# Patient Record
Sex: Female | Born: 1940
Health system: Southern US, Community
[De-identification: ages and names within clinical notes are randomized; demographics above are authoritative.]

## PROBLEM LIST (undated history)

## (undated) DIAGNOSIS — I10 Essential (primary) hypertension: Secondary | ICD-10-CM

## (undated) DIAGNOSIS — M1990 Unspecified osteoarthritis, unspecified site: Secondary | ICD-10-CM

## (undated) HISTORY — DX: Essential (primary) hypertension: I10

## (undated) HISTORY — DX: Unspecified osteoarthritis, unspecified site: M19.90

## (undated) HISTORY — PX: NASAL SINUS SURGERY: SHX719

## (undated) HISTORY — PX: OTHER SURGICAL HISTORY: SHX169

## (undated) HISTORY — PX: BUNIONECTOMY: SHX129

## (undated) HISTORY — PX: JOINT REPLACEMENT: SHX530

---

## 1998-03-21 ENCOUNTER — Other Ambulatory Visit: Admission: RE | Admit: 1998-03-21 | Discharge: 1998-03-21 | Payer: Self-pay | Admitting: Gynecology

## 1999-03-08 ENCOUNTER — Other Ambulatory Visit: Admission: RE | Admit: 1999-03-08 | Discharge: 1999-03-08 | Payer: Self-pay | Admitting: Gynecology

## 2000-06-25 ENCOUNTER — Other Ambulatory Visit: Admission: RE | Admit: 2000-06-25 | Discharge: 2000-06-25 | Payer: Self-pay | Admitting: Gynecology

## 2002-03-22 ENCOUNTER — Other Ambulatory Visit: Admission: RE | Admit: 2002-03-22 | Discharge: 2002-03-22 | Payer: Self-pay | Admitting: Gynecology

## 2002-06-23 HISTORY — PX: COLONOSCOPY: SHX174

## 2002-07-20 ENCOUNTER — Ambulatory Visit (HOSPITAL_COMMUNITY): Admission: RE | Admit: 2002-07-20 | Discharge: 2002-07-20 | Payer: Self-pay | Admitting: Gastroenterology

## 2003-12-05 ENCOUNTER — Other Ambulatory Visit: Admission: RE | Admit: 2003-12-05 | Discharge: 2003-12-05 | Payer: Self-pay | Admitting: Gynecology

## 2004-01-06 ENCOUNTER — Other Ambulatory Visit: Admission: RE | Admit: 2004-01-06 | Discharge: 2004-01-06 | Payer: Self-pay | Admitting: Gynecology

## 2004-02-14 DIAGNOSIS — B083 Erythema infectiosum [fifth disease]: Secondary | ICD-10-CM | POA: Insufficient documentation

## 2004-05-30 ENCOUNTER — Other Ambulatory Visit: Admission: RE | Admit: 2004-05-30 | Discharge: 2004-05-30 | Payer: Self-pay | Admitting: Gynecology

## 2006-03-05 ENCOUNTER — Other Ambulatory Visit: Admission: RE | Admit: 2006-03-05 | Discharge: 2006-03-05 | Payer: Self-pay | Admitting: Gynecology

## 2007-06-24 LAB — HM COLONOSCOPY

## 2008-08-29 ENCOUNTER — Encounter: Payer: Self-pay | Admitting: Family Medicine

## 2008-09-05 ENCOUNTER — Inpatient Hospital Stay (HOSPITAL_COMMUNITY): Admission: RE | Admit: 2008-09-05 | Discharge: 2008-09-08 | Payer: Self-pay | Admitting: Orthopedic Surgery

## 2008-10-10 ENCOUNTER — Inpatient Hospital Stay (HOSPITAL_COMMUNITY): Admission: RE | Admit: 2008-10-10 | Discharge: 2008-10-12 | Payer: Self-pay | Admitting: Orthopedic Surgery

## 2009-01-04 ENCOUNTER — Ambulatory Visit: Payer: Self-pay | Admitting: Family Medicine

## 2009-01-04 DIAGNOSIS — I10 Essential (primary) hypertension: Secondary | ICD-10-CM

## 2009-01-05 LAB — CONVERTED CEMR LAB
BUN: 12 mg/dL (ref 6–23)
Chloride: 106 meq/L (ref 96–112)
GFR calc non Af Amer: 105.54 mL/min (ref >60)
Glucose, Bld: 104 mg/dL — ABNORMAL HIGH (ref 70–99)
Potassium: 4.8 meq/L (ref 3.5–5.1)

## 2009-02-13 ENCOUNTER — Encounter: Payer: Self-pay | Admitting: Family Medicine

## 2009-02-13 DIAGNOSIS — E785 Hyperlipidemia, unspecified: Secondary | ICD-10-CM | POA: Insufficient documentation

## 2009-02-13 DIAGNOSIS — M810 Age-related osteoporosis without current pathological fracture: Secondary | ICD-10-CM | POA: Insufficient documentation

## 2009-02-13 DIAGNOSIS — L259 Unspecified contact dermatitis, unspecified cause: Secondary | ICD-10-CM

## 2009-04-05 ENCOUNTER — Ambulatory Visit: Payer: Self-pay | Admitting: Family Medicine

## 2009-04-05 DIAGNOSIS — J309 Allergic rhinitis, unspecified: Secondary | ICD-10-CM | POA: Insufficient documentation

## 2009-04-12 ENCOUNTER — Encounter: Payer: Self-pay | Admitting: Family Medicine

## 2009-08-02 ENCOUNTER — Ambulatory Visit: Payer: Self-pay | Admitting: Family Medicine

## 2009-10-11 ENCOUNTER — Ambulatory Visit: Payer: Self-pay | Admitting: Family Medicine

## 2009-12-07 ENCOUNTER — Ambulatory Visit: Payer: Self-pay | Admitting: Gynecology

## 2009-12-07 ENCOUNTER — Other Ambulatory Visit: Admission: RE | Admit: 2009-12-07 | Discharge: 2009-12-07 | Payer: Self-pay | Admitting: Gynecology

## 2009-12-18 ENCOUNTER — Telehealth: Payer: Self-pay | Admitting: Family Medicine

## 2009-12-19 ENCOUNTER — Ambulatory Visit: Payer: Self-pay | Admitting: Family Medicine

## 2009-12-19 DIAGNOSIS — R32 Unspecified urinary incontinence: Secondary | ICD-10-CM

## 2009-12-19 LAB — CONVERTED CEMR LAB
Blood in Urine, dipstick: NEGATIVE
HDL goal, serum: 40 mg/dL
Nitrite: NEGATIVE
Protein, U semiquant: NEGATIVE

## 2009-12-20 LAB — CONVERTED CEMR LAB
CO2: 32 meq/L (ref 19–32)
Chloride: 104 meq/L (ref 96–112)
Direct LDL: 142.6 mg/dL
HDL: 55.7 mg/dL (ref >39.00)
Sodium: 144 meq/L (ref 135–145)
Total CHOL/HDL Ratio: 4
Triglycerides: 95 mg/dL (ref 0.0–149.0)

## 2010-01-19 ENCOUNTER — Ambulatory Visit: Payer: Self-pay | Admitting: Family Medicine

## 2010-06-14 ENCOUNTER — Ambulatory Visit: Payer: Self-pay | Admitting: Family Medicine

## 2010-06-21 ENCOUNTER — Encounter: Payer: Self-pay | Admitting: Family Medicine

## 2010-08-22 ENCOUNTER — Ambulatory Visit: Payer: Self-pay | Admitting: Family Medicine

## 2010-10-23 NOTE — Assessment & Plan Note (Signed)
Summary: flu shot/njr  Nurse Visit   Allergies: 1)  ! Penicillin V Potassium (Penicillin V Potassium) 2)  Fosamax (Alendronate Sodium) 3)  Celebrex (Celecoxib)  Orders Added: 1)  Flu Vaccine 9yrs + MEDICARE PATIENTS [Q2039] 2)  Administration Flu vaccine - MCR [G0008] Flu Vaccine Consent Questions     Do you have a history of severe allergic reactions to this vaccine? no    Any prior history of allergic reactions to egg and/or gelatin? no    Do you have a sensitivity to the preservative Thimersol? no    Do you have a past history of Guillan-Barre Syndrome? no    Do you currently have an acute febrile illness? no    Have you ever had a severe reaction to latex? no    Vaccine information given and explained to patient? yes    Are you currently pregnant? no    Lot Number:[id number]   Exp Date:03/23/2011   Site Given  Left Deltoid IM.lbmedflu

## 2010-10-23 NOTE — Progress Notes (Signed)
Summary: Blood pressure concern  Phone Note Call from Patient Call back at Home Phone [phone]   Caller: Patient Reason for Call: Talk to Nurse Summary of Call: Patient has questions regarding blood pressure.  Please call Initial call taken by: Everrett Coombe,  December 18, 2009 9:20 AM  Follow-up for Phone Call        Called and spoke with pt, she is experiencing frequent urination symptoms, saw her OB/GYN and he thinks the HCTZ with her BP pill may be causing this.  Pt states her BP has been OK, does not check it very often,  pt wanted to schedule OV to discuss.  OV scheduled for tomorrow. Follow-up by: Sid Falcon LPN,  December 18, 2009 9:31 AM

## 2010-10-23 NOTE — Assessment & Plan Note (Signed)
Summary: Frequent urination, bladder concerns/nn   Vital Signs:  Patient profile:   70 year old female Weight:      151 pounds Temp:     97.7 degrees F oral BP sitting:   140 / 90  (left arm) Cuff size:   regular  Vitals Entered By: Sid Falcon LPN (December 19, 2009 9:30 AM) CC: frequent urination, bladder concerns, Hypertension Management, Lipid Management   History of Present Illness: Urine symptoms of urine urgency and some mild stress incontinence for several months. GYN did not see evidence for cystocele.  No burning, fever, or chills.  Occ leakage with movement, cough, sneeze.  Occ nocturia.  Minimal caffeine use.    Good stream and no obstructive symptoms.  Other issue is hx hyperlipidemia and requests repeat lipids.  Never treated with meds.  No hx of CAD.  Htn-compliant with meds.  Hypertension History:      She denies headache, chest pain, palpitations, dyspnea with exertion, orthopnea, peripheral edema, neurologic problems, syncope, and side effects from treatment.        Positive major cardiovascular risk factors include female age 71 years old or older, hyperlipidemia, and hypertension.  Negative major cardiovascular risk factors include no history of diabetes, negative family history for ischemic heart disease, and non-tobacco-user status.        Further assessment for target organ damage reveals no history of ASHD, stroke/TIA, or peripheral vascular disease.    Lipid Management History:      Positive NCEP/ATP III risk factors include female age 66 years old or older and hypertension.  Negative NCEP/ATP III risk factors include no history of early menopause without estrogen hormone replacement, non-diabetic, no family history for ischemic heart disease, non-tobacco-user status, no ASHD (atherosclerotic heart disease), no prior stroke/TIA, no peripheral vascular disease, and no history of aortic aneurysm.      Allergies: 1)  ! Penicillin V Potassium (Penicillin V  Potassium) 2)  Fosamax (Alendronate Sodium) 3)  Celebrex (Celecoxib)  Past History:  Past Medical History: Last updated: 10/11/2009  arthritis hay fever, allergies Hypertension Hyperlipidemia, mild Osteoporosis Rosacea PMH reviewed for relevance  Review of Systems  The patient denies fever and hematuria.    Physical Exam  General:  Well-developed,well-nourished,in no acute distress; alert,appropriate and cooperative throughout examination Head:  Normocephalic and atraumatic without obvious abnormalities. No apparent alopecia or balding. Mouth:  Oral mucosa and oropharynx without lesions or exudates.  Teeth in good repair. Neck:  No deformities, masses, or tenderness noted. Lungs:  Normal respiratory effort, chest expands symmetrically. Lungs are clear to auscultation, no crackles or wheezes. Heart:  Normal rate and regular rhythm. S1 and S2 normal without gallop, murmur, click, rub or other extra sounds. Genitalia:  deferred as done recently per GI.   Impression & Recommendations:  Problem # 1:  UNSPECIFIED URINARY INCONTINENCE (ICD-788.30) Assessment New mixed urge and stress components.  Trial of Vesicare 5 mg once daily with samples provided.  Also instructed on Kegel exercises. Orders: UA Dipstick w/o Micro (manual) (21308)  Problem # 2:  HYPERLIPIDEMIA (ICD-272.4)  Orders: Venipuncture (65784) TLB-Lipid Panel (80061-LIPID)  Problem # 3:  HYPERTENSION (ICD-401.9) Assessment: Unchanged check electrolytes. Her updated medication list for this problem includes:    Lisinopril-hydrochlorothiazide 20-12.5 Mg Tabs (Lisinopril-hydrochlorothiazide) ..... Once daily  Orders: Venipuncture (69629) TLB-BMP (Basic Metabolic Panel-BMET) (80048-METABOL)  Complete Medication List: 1)  Robaxin 500 Mg Tabs (Methocarbamol) .... As needed 2)  Aspirin Adult Low Strength 81 Mg Tbec (Aspirin) .... Once daily 3)  Lisinopril-hydrochlorothiazide 20-12.5 Mg Tabs  (Lisinopril-hydrochlorothiazide) .... Once daily 4)  Allegra 180 Mg Tabs (Fexofenadine hcl) .... As needed 5)  Vesicare 5 Mg Tabs (Solifenacin succinate) .... One by mouth at bedtime  Hypertension Assessment/Plan:      The patient's hypertensive risk group is category B: At least one risk factor (excluding diabetes) with no target organ damage.  Today's blood pressure is 140/90.    Lipid Assessment/Plan:      Based on NCEP/ATP III, the patient's risk factor category is "2 or more risk factors and a calculated 10 year CAD risk of > 20%".  The patient's lipid goals are as follows: Total cholesterol goal is 200; LDL cholesterol goal is 130; HDL cholesterol goal is 40; Triglyceride goal is 150.    Patient Instructions: 1)  Please schedule a follow-up appointment in 1 month.  Prescriptions: VESICARE 5 MG TABS (SOLIFENACIN SUCCINATE) one by mouth at bedtime  #30 x 0   Entered and Authorized by:   Evelena Peat MD   Signed by:   Sid Falcon LPN on 16/06/9603   Method used:   Print then Give to Patient   RxID:   [id number]   Laboratory Results   Urine Tests    Routine Urinalysis   Color: lt. yellow Appearance: Clear Glucose: negative   (Normal Range: Negative) Bilirubin: negative   (Normal Range: Negative) Ketone: negative   (Normal Range: Negative) Spec. Gravity: <1.005   (Normal Range: 1.003-1.035) Blood: negative   (Normal Range: Negative) pH: 8.5   (Normal Range: 5.0-8.0) Protein: negative   (Normal Range: Negative) Urobilinogen: 0.2   (Normal Range: 0-1) Nitrite: negative   (Normal Range: Negative) Leukocyte Esterace: small   (Normal Range: Negative)    Comments: Sid Falcon LPN  December 19, 2009 10:17 AM

## 2010-10-23 NOTE — Assessment & Plan Note (Signed)
Summary: roa/fup/rcd   Vital Signs:  Patient profile:   70 year old female Weight:      150 pounds Temp:     97.7 degrees F oral BP sitting:   130 / 90  (left arm) Cuff size:   regular  Vitals Entered By: Sid Falcon LPN (August 22, 2010 11:01 AM)  History of Present Illness: Patient here for medical followup. Hypertension treated with lisinopril HCTZ. Patient compliant with therapy. No side effects. Blood pressures have been well controlled.  History osteoporosis with bilateral knee replacements. Ambulating much better since her surgeries.  Urine urge incontinence. Improved with VESIcare. No significant side effects.  Allergies: 1)  ! Penicillin V Potassium (Penicillin V Potassium) 2)  Fosamax (Alendronate Sodium) 3)  Celebrex (Celecoxib)  Past History:  Past Medical History: Last updated: 10/11/2009  arthritis hay fever, allergies Hypertension Hyperlipidemia, mild Osteoporosis Rosacea  Past Surgical History: Last updated: 01/04/2009 bil knee replacements 2009, 2010  Family History: Last updated: 02/13/2009 Family History of Arthritis, rheumatoid--mother Prostate cancer, father Family History Other cancer--esophageal (sister), deceased  Social History: Last updated: 01/04/2009 Married Former Smoker Alcohol use-no Occupation:  Retired Runner, broadcasting/film/video  Risk Factors: Smoking Status: quit (01/04/2009)  Review of Systems  The patient denies anorexia, fever, weight loss, weight gain, chest pain, syncope, dyspnea on exertion, peripheral edema, prolonged cough, headaches, hemoptysis, abdominal pain, melena, hematochezia, severe indigestion/heartburn, and depression.    Physical Exam  General:  Well-developed,well-nourished,in no acute distress; alert,appropriate and cooperative throughout examination Ears:  External ear exam shows no significant lesions or deformities.  Otoscopic examination reveals clear canals, tympanic membranes are intact bilaterally without  bulging, retraction, inflammation or discharge. Hearing is grossly normal bilaterally. Mouth:  Oral mucosa and oropharynx without lesions or exudates.  Teeth in good repair. Neck:  No deformities, masses, or tenderness noted. Lungs:  Normal respiratory effort, chest expands symmetrically. Lungs are clear to auscultation, no crackles or wheezes. Heart:  normal rate and regular rhythm.   Extremities:  No clubbing, cyanosis, edema, or deformity noted with normal full range of motion of all joints.   Neurologic:  alert & oriented X3 and cranial nerves II-XII intact.   Psych:  normally interactive, good eye contact, not anxious appearing, and not depressed appearing.     Impression & Recommendations:  Problem # 1:  HYPERTENSION (ICD-401.9)  Her updated medication list for this problem includes:    Lisinopril-hydrochlorothiazide 20-12.5 Mg Tabs (Lisinopril-hydrochlorothiazide) ..... Once daily  Problem # 2:  UNSPECIFIED URINARY INCONTINENCE (ICD-788.30)  Complete Medication List: 1)  Robaxin 500 Mg Tabs (Methocarbamol) .... As needed 2)  Aspirin Adult Low Strength 81 Mg Tbec (Aspirin) .... Once daily 3)  Lisinopril-hydrochlorothiazide 20-12.5 Mg Tabs (Lisinopril-hydrochlorothiazide) .... Once daily 4)  Allegra 180 Mg Tabs (Fexofenadine hcl) .... As needed 5)  Vesicare 5 Mg Tabs (Solifenacin succinate) .... One by mouth at bedtime 6)  Centrum Silver Ultra Womens Tabs (Multiple vitamins-minerals) .... Once daily  Patient Instructions: 1)  Please schedule a follow-up appointment in 6 months .    Orders Added: 1)  Est. Patient Level III [08657]    Preventive Care Screening  Bone Density:    Date:  05/24/2010    Results:  normal std dev

## 2010-10-23 NOTE — Assessment & Plan Note (Signed)
Summary: 6 month fup//ccm   Vital Signs:  Patient profile:   70 year old female Weight:      144 pounds Temp:     98.4 degrees F oral BP sitting:   110 / 80  (left arm) Cuff size:   regular  Vitals Entered By: Sid Falcon LPN (October 11, 2009 10:08 AM) CC: 6 month follow-up, Hypertension Management Is Patient Diabetic? No   History of Present Illness: Routine medical followup. Patient has history of seasonal allergies, osteoporosis, hyperlipidemia, and hypertension.  take Allegra for allergies and this is well controlled. Continues to see dermatologist for rosacea. Takes calcium and vitamin D. Osteoporosis has been followed by a gynecologist in past. No recent falls or fractures.  Hypertension History:      She denies headache, chest pain, palpitations, dyspnea with exertion, orthopnea, PND, peripheral edema, visual symptoms, neurologic problems, syncope, and side effects from treatment.  She notes no problems with any antihypertensive medication side effects.        Positive major cardiovascular risk factors include female age 4 years old or older, hyperlipidemia, and hypertension.  Negative major cardiovascular risk factors include non-tobacco-user status.     Allergies: 1)  ! Penicillin V Potassium (Penicillin V Potassium) 2)  ! Fosamax (Alendronate Sodium) 3)  ! Celebrex (Celecoxib)  Past History:  Past Surgical History: Last updated: 01/04/2009 bil knee replacements 2009, 2010  Social History: Last updated: 01/04/2009 Married Former Smoker Alcohol use-no Occupation:  Retired Runner, broadcasting/film/video  Past Medical History:  arthritis hay fever, allergies Hypertension Hyperlipidemia, mild Osteoporosis Rosacea  Review of Systems      See HPI  Physical Exam  General:  Well-developed,well-nourished,in no acute distress; alert,appropriate and cooperative throughout examination Mouth:  Oral mucosa and oropharynx without lesions or exudates.  Teeth in good repair. Neck:   No deformities, masses, or tenderness noted. Lungs:  Normal respiratory effort, chest expands symmetrically. Lungs are clear to auscultation, no crackles or wheezes. Heart:  normal rate, regular rhythm, and no gallop.   Extremities:  No clubbing, cyanosis, edema, or deformity noted with normal full range of motion of all joints.     Impression & Recommendations:  Problem # 1:  HYPERTENSION (ICD-401.9) Assessment Unchanged  Her updated medication list for this problem includes:    Lisinopril-hydrochlorothiazide 20-12.5 Mg Tabs (Lisinopril-hydrochlorothiazide) ..... Once daily  Complete Medication List: 1)  Robaxin 500 Mg Tabs (Methocarbamol) .... As needed 2)  Aspirin Adult Low Strength 81 Mg Tbec (Aspirin) .... Once daily 3)  Lisinopril-hydrochlorothiazide 20-12.5 Mg Tabs (Lisinopril-hydrochlorothiazide) .... Once daily 4)  Allegra 180 Mg Tabs (Fexofenadine hcl) .... As needed  Hypertension Assessment/Plan:      The patient's hypertensive risk group is category B: At least one risk factor (excluding diabetes) with no target organ damage.  Today's blood pressure is 110/80.    Patient Instructions: 1)  Please schedule a follow-up appointment in 6 months .  2)  Take calcium +vitamin D daily.  Goal is Ca 1,000 mg/day 3)  Continue regular walking. 4)  Schedule your mammogram if > 1 year.

## 2010-10-23 NOTE — Assessment & Plan Note (Signed)
Summary: 1 month follow up/cjr   Vital Signs:  Patient profile:   70 year old female Weight:      149 pounds Temp:     98.2 degrees F oral BP sitting:   110 / 72  (left arm) Cuff size:   regular  Vitals Entered By: Sid Falcon LPN (January 19, 2010 10:20 AM) CC: 1 month follow-up, incontinence   History of Present Illness: Followup mixed incontinence with urgency and stress incontinence. Started VESIcare and after a couple of days symptoms did improve. She did this for 3 weeks and discontinued and symptoms have continued to do well. She did not have any side effects such as dry mouth or constipation. Still has occasional stress incontinence. Doing Kegel exercises intermittently.  Recent GYN eval and per pt no signif cystocele.  Allergies: 1)  ! Penicillin V Potassium (Penicillin V Potassium) 2)  Fosamax (Alendronate Sodium) 3)  Celebrex (Celecoxib)  Past History:  Past Medical History: Last updated: 10/11/2009  arthritis hay fever, allergies Hypertension Hyperlipidemia, mild Osteoporosis Rosacea  Past Surgical History: Last updated: 01/04/2009 bil knee replacements 2009, 2010  Review of Systems  The patient denies fever, weight loss, abdominal pain, and hematuria.    Physical Exam  General:  Well-developed,well-nourished,in no acute distress; alert,appropriate and cooperative throughout examination Neck:  No deformities, masses, or tenderness noted. Lungs:  Normal respiratory effort, chest expands symmetrically. Lungs are clear to auscultation, no crackles or wheezes. Heart:  Normal rate and regular rhythm. S1 and S2 normal without gallop, murmur, click, rub or other extra sounds.   Impression & Recommendations:  Problem # 1:  UNSPECIFIED URINARY INCONTINENCE (ICD-788.30) Assessment Improved refilled VESIcare. She is considering using this on a p.r.n. basis  Complete Medication List: 1)  Robaxin 500 Mg Tabs (Methocarbamol) .... As needed 2)  Aspirin Adult  Low Strength 81 Mg Tbec (Aspirin) .... Once daily 3)  Lisinopril-hydrochlorothiazide 20-12.5 Mg Tabs (Lisinopril-hydrochlorothiazide) .... Once daily 4)  Allegra 180 Mg Tabs (Fexofenadine hcl) .... As needed 5)  Vesicare 5 Mg Tabs (Solifenacin succinate) .... One by mouth at bedtime 6)  Centrum Silver Ultra Womens Tabs (Multiple vitamins-minerals) .... Once daily  Patient Instructions: 1)  Please schedule a follow-up appointment in 6 months .  Prescriptions: VESICARE 5 MG TABS (SOLIFENACIN SUCCINATE) one by mouth at bedtime  #30 x 11   Entered and Authorized by:   Evelena Peat MD   Signed by:   Evelena Peat MD on 01/19/2010   Method used:   Faxed to ...       [facility]       [address]       [place], [place]  [address]       Ph: [phone] or [phone]       Fax: [phone]   RxID:   [id number]

## 2010-10-24 ENCOUNTER — Telehealth: Payer: Self-pay | Admitting: *Deleted

## 2010-10-24 NOTE — Telephone Encounter (Signed)
Pt would like she and her husband to be scheduled for colonoscopy with Dr. Loreta Ave.

## 2010-10-29 ENCOUNTER — Telehealth: Payer: Self-pay | Admitting: *Deleted

## 2010-10-29 DIAGNOSIS — Z139 Encounter for screening, unspecified: Secondary | ICD-10-CM

## 2010-10-29 NOTE — Telephone Encounter (Signed)
Pt requesting referral for colonscopy

## 2010-10-30 NOTE — Telephone Encounter (Signed)
OK to set up GI referral.  Pt requesting colonoscopy.  Set up with previous GI if she has seen someone previously.

## 2010-11-07 ENCOUNTER — Encounter: Payer: Self-pay | Admitting: Family Medicine

## 2010-11-07 ENCOUNTER — Ambulatory Visit (INDEPENDENT_AMBULATORY_CARE_PROVIDER_SITE_OTHER): Payer: Medicare Other | Admitting: Family Medicine

## 2010-11-07 VITALS — BP 120/82 | Temp 98.0°F | Ht <= 58 in | Wt 153.0 lb

## 2010-11-07 DIAGNOSIS — J019 Acute sinusitis, unspecified: Secondary | ICD-10-CM

## 2010-11-07 MED ORDER — AZITHROMYCIN 250 MG PO TABS: 250.0000 mg | ORAL_TABLET | Freq: Every day | ORAL | Status: AC

## 2010-11-07 NOTE — Progress Notes (Signed)
  Subjective:    Patient ID: [name], female    DOB: [birth year], 70 y.o.   MRN: [id number]  HPI  Patient is seen with concerns for acute sinusitis. Several day history of facial pain right maxillary sinus greater then left with some intermittent bloody colored nasal discharge. She has some perennial allergies and takes Allegra without relief. Also recently using saline nasal irrigation. No fever. No significant headaches. Rare dry cough. No sore throat.   Review of Systems  as per history of present illness    Objective:   Physical Exam  patient is alert in no distress Oropharynx is clear Nasal exam reveals some minimal crusted dried blood left nares. Erythematous mucosa was noted. No purulent secretions but does have some yellow mucous right naris   neck supple with no adenopathy Chest clear to auscultation Heart regular rhythm and rate       Assessment & Plan:   acute sinusitis. Start Zithromax.  Plenty of fluids. Continue saline nasal irrigation

## 2010-11-07 NOTE — Patient Instructions (Signed)

## 2011-01-01 ENCOUNTER — Other Ambulatory Visit: Payer: Self-pay | Admitting: Family Medicine

## 2011-01-07 LAB — CBC
HCT: 32.6 % — ABNORMAL LOW (ref 36.0–46.0)
MCHC: 33.1 g/dL (ref 30.0–36.0)
MCHC: 33.2 g/dL (ref 30.0–36.0)
MCV: 104 fL — ABNORMAL HIGH (ref 78.0–100.0)
MCV: 103.6 fL — ABNORMAL HIGH (ref 78.0–100.0)
MCV: 105.1 fL — ABNORMAL HIGH (ref 78.0–100.0)
Platelets: 218 10*3/uL (ref 150–400)
Platelets: 221 10*3/uL (ref 150–400)
Platelets: 227 10*3/uL (ref 150–400)
RBC: 2.75 MIL/uL — ABNORMAL LOW (ref 3.87–5.11)
RDW: 16.7 % — ABNORMAL HIGH (ref 11.5–15.5)
RDW: 16.4 % — ABNORMAL HIGH (ref 11.5–15.5)
WBC: 12.5 10*3/uL — ABNORMAL HIGH (ref 4.0–10.5)
WBC: 7.9 10*3/uL (ref 4.0–10.5)

## 2011-01-07 LAB — BASIC METABOLIC PANEL
BUN: 12 mg/dL (ref 6–23)
BUN: 11 mg/dL (ref 6–23)
BUN: 8 mg/dL (ref 6–23)
CO2: 27 mEq/L (ref 19–32)
CO2: 26 mEq/L (ref 19–32)
Calcium: 9.1 mg/dL (ref 8.4–10.5)
Chloride: 97 mEq/L (ref 96–112)
Chloride: 103 mEq/L (ref 96–112)
Creatinine, Ser: 0.48 mg/dL (ref 0.4–1.2)
Creatinine, Ser: 0.43 mg/dL (ref 0.4–1.2)
Creatinine, Ser: 0.48 mg/dL (ref 0.4–1.2)
GFR calc Af Amer: >60 mL/min (ref >60)
GFR calc non Af Amer: >60 mL/min (ref >60)
Glucose, Bld: 100 mg/dL — ABNORMAL HIGH (ref 70–99)
Glucose, Bld: 121 mg/dL — ABNORMAL HIGH (ref 70–99)
Potassium: 4.2 mEq/L (ref 3.5–5.1)

## 2011-01-07 LAB — TYPE AND SCREEN

## 2011-01-07 LAB — URINALYSIS, ROUTINE W REFLEX MICROSCOPIC
Ketones, ur: NEGATIVE mg/dL
Nitrite: NEGATIVE
Urobilinogen, UA: 0.2 mg/dL (ref 0.0–1.0)
pH: 7.5 (ref 5.0–8.0)

## 2011-01-07 LAB — PROTIME-INR: INR: 1 (ref 0.00–1.49)

## 2011-01-07 LAB — DIFFERENTIAL
Basophils Relative: 0 % (ref 0–1)
Eosinophils Absolute: 0.2 10*3/uL (ref 0.0–0.7)
Neutrophils Relative %: 70 % (ref 43–77)

## 2011-01-07 LAB — APTT: aPTT: 29 seconds (ref 24–37)

## 2011-02-05 NOTE — Discharge Summary (Signed)
NAMEWHISPER, KURKA                ACCOUNT NO.:  [id number]   MEDICAL RECORD NO.:  [id number]          PATIENT TYPE:  INP   LOCATION:  1617                         FACILITY:  [facility]   PHYSICIAN:  Madlyn Frankel. Charlann Boxer, M.D.  DATE OF BIRTH:  Dec 13, 1940   DATE OF ADMISSION:  09/05/2008  DATE OF DISCHARGE:  09/08/2008                               DISCHARGE SUMMARY   ADMITTING DIAGNOSES:  1. Osteoarthritis.  2. Hypertension.   DISCHARGE DIAGNOSES:  1. Osteoarthritis.  2. Hypertension.  3. Postoperative hypokalemia.   HISTORY OF PRESENT ILLNESS:  A 70 year old female with a history of left  knee pain secondary to osteoarthritis, refractory to all conservative  treatment.  Presurgically assessed by primary care physician prior to  surgery.   CONSULTS:  None.   PROCEDURE:  Left total knee replacement by surgeon Dr. Durene Romans,  assistant Dwyane Luo.   LABORATORY DATA:  A CBC final reading showed a white count of 9.2,  hemoglobin 11.2, hematocrit 32.6 and platelets 185.  Coags normal.  Metabolic panel:  Sodium 133, potassium 3.9, glucose was 117, creatinine  0.41.  Calcium 8.9.  UA was negative.   CARDIOLOGY:  EKG showed sinus tachycardia, left axis deviation.   RADIOLOGY:  Chest 2-view done on July 22, 2008 showed no active  cardiopulmonary disease.   HOSPITAL COURSE:  The patient was admitted to the hospital and underwent  a left total knee replacement.  During her course her stay was  unremarkable.  She did have some nausea and vomiting which resolved by  her discharge.  She was replaced with some K-Dur which resolved  hypokalemia postoperatively.  Dressing was changed.  No significant  drainage from the wound.  We changed her pain medicines over to Darvon  to help with nausea and vomiting, this seemed to help.  Physical  therapy, she did well.  Met all criteria for discharge home by day 3.  When we saw her on that day, her nausea was better.  She was afebrile  with a wound  that was clean and dry.   DISCHARGE DISPOSITION:  Discharged to home health care PT, stable and  improved condition.   DISCHARGE PHYSICAL THERAPY:  Weightbearing as tolerated.   DISCHARGE DIET:  Heart-healthy.   DISCHARGE WOUND CARE:  Keep dry.   DISCHARGE MEDICATIONS:  1. Aspirin 325 mg p.o. b.i.d. x 6 weeks.  2. Robaxin 500 mg p.o. q. 6.  3. Iron 325 mg p.o. t.i.d. x2 weeks.  4. Colace 100 mg p.o. b.i.d.  5. MiraLax 17 g p.o. once daily.  6. Darvon 65 mg 1-2 p.o. q. 4-6 p.r.n. pain.  7. Tylenol 650 mg q. 6 p.r.n. pain.  8. Multivitamin daily.  9. Lisinopril 10 mg p.o. daily.   DISCHARGE FOLLOWUP:  With Dr. Charlann Boxer at phone number [phone] in 2 weeks.     ______________________________  Yetta Glassman [address], [place]      Madlyn Frankel. Charlann Boxer, M.D.  Electronically Signed    BLM/MEDQ  D:  10/04/2008  T:  10/04/2008  Job:  [id number]   cc:   Evelena Peat, M.D.  Areatha Keas, M.D.  Fax: [phone]

## 2011-02-05 NOTE — Op Note (Signed)
[name], [name]                ACCOUNT NO.:  [id number]   MEDICAL RECORD NO.:  [id number]          PATIENT TYPE:  INP   LOCATION:  0004                         FACILITY:  [facility]   PHYSICIAN:  Madlyn Frankel. Charlann Boxer, M.D.  DATE OF BIRTH:  [birth year]   DATE OF PROCEDURE:  10/10/2008  DATE OF DISCHARGE:                               OPERATIVE REPORT   PREOPERATIVE DIAGNOSES:  1. Right knee osteoarthritis.  2. Status post left total knee replacement.   POSTOPERATIVE DIAGNOSES:  1. Right knee osteoarthritis.  2. Status post left total knee replacement.   PROCEDURE:  Right knee total knee replacement utilizing DePuy  components, size 2.5 rotating platform  posterior stabilized femur, 2.5  tibial tray, a 12.5 insert and a 35 patellar button to match the  contralateral knee.   SURGEON:  Madlyn Frankel. Charlann Boxer, M.D.   ASSISTANT:  Yetta Glassman. Mann, PA.   ANESTHESIA:  Duramorph spinal.   SPECIMEN:  None.   FINDINGS:  None.   COMPLICATIONS:  None.   DRAINS:  One Hemovac.   TOURNIQUET TIME:  38 minutes at 250 mmHg.   ESTIMATED BLOOD LOSS:  Minimal.   INDICATIONS FOR PROCEDURE:  [name] is a very pleasant 70 year old  female with a history of left total knee replacement just within the  last 6 weeks with end stage bilateral knee osteoarthritis.  She had done  exceptionally well with her left knee and is ready to proceed with the  right knee.  We reviewed the risks and benefits of the procedure.  Consent was obtained for the benefit of pain relief.   PROCEDURE IN DETAIL:  The patient was brought to the operative theater.  Once adequate anesthesia, preoperative antibiotics, Ancef administered  the patient was positioned supine with a thigh tourniquet placed.  The  right lower extremity was pre-scrubbed and prepped and draped in sterile  fashion.  A time-out was performed identifying the patient and the  extremity.  The leg was exsanguinated, tourniquet elevated to 250 mmHg.   A midline  incision was made, followed by initial debridement and medial  arthrotomy.   Attention was directed to the patella.  The precut measure was 22 mm.  I  resected down to 14 mm and used a 35 patellar button again, which fit  great of the cut surface.  A metal shim was placed to protect the cut  surface from the retractors and the saw blades.   Attention was now directed to the femur.  The femoral canal was opened  with the drill, irrigated to prevent fat emboli.  We then placed an  intramedullary rod and at 3 degrees of valgus I resected 10 mm off the  distal femur, contralateral knee.   I then attended to the tibia.  With the tibia subluxated anteriorly, the  meniscus and cruciate stumps debrided.  I used an extramedullary guide  and resected 10 mm of bone off the proximal lateral tibia.  We checked  with an extension block and felt that the knee came to at least  extension with a slight bit hyper with a  10 insert.  I then checked the  cut surface to make sure it was perpendicular.  Once I was satisfied  that it was I set the location of the femoral component based on this.  I sized this femur to be a size 2.5, cut  the same as the contralateral  knee.  I then set the rotation based off the proximal tibial cut.  The  rotation and smooth pins were placed and I extended this out with a 2.5  cutting block.  The 2.5 four-in-one cutting block was exchanged and the  anterior, posterior and chamfer cuts all made.   The final box cut was made off the lateral aspect of distal femur.   Attention was redirected back to the tibia.  The tibia was subluxated  anteriorly.  I felt the 2.5 tibia fit better on this side compared with  a size 2 on the left knee.  It was pinned into position to the medial  third of the tubercle, drilled and keel punched.  Trial reduction was  now carried out.  At this point we trialed with a 10 and felt the knee  came out to extension with slight bit of hyper.  Based on  this I was  going to hold my final component insert until the final trial.   At this point, all trial components were removed.  We irrigated the knee  was normal saline solution with pulse lavage.  I injected the synovial  capsule junction with 60 mL of 0.25% Marcaine with epinephrine and 1 mL  of Toradol.  The trial components were opened and cement mixed.   The final components were cemented into position.  The knee was brought  into extension with 12.5 insert.  Extruded cement was removed.  Once  cement had cured, excessive cement was removed throughout the knee.  Once the cement had cured, I removed and sure there was no remaining  cement posteriorly.  The final 12.5 insert to match the 2.5 femur was  inserted.  The knee was re-irrigated with pulse lavage with normal  saline solution.  I placed a medium Hemovac drain and we let down the  tourniquet at 38 minutes.  There was no significant hemostasis required.  The extension mechanism was no reapproximated using #1 Vicryl with the  knee in flexion.  The remainder of the wound was closed with 2-0 Vicryl  and a running 4-0 Monocryl.  The knee was cleaned, dried and dressed  sterilely in a sterile bulky Jones dressing.  She was then transferred  to the recovery room in stable condition tolerating the  procedure well.      Madlyn Frankel Charlann Boxer, M.D.  Electronically Signed     MDO/MEDQ  D:  10/10/2008  T:  10/10/2008  Job:  [id number]

## 2011-02-05 NOTE — H&P (Signed)
[name], [name]                ACCOUNT NO.:  [id number]   MEDICAL RECORD NO.:  [id number]          PATIENT TYPE:  INP   LOCATION:                               FACILITY:  [facility]   PHYSICIAN:  Madlyn Frankel. Charlann Boxer, M.D.  DATE OF BIRTH:  09-08-1941   DATE OF ADMISSION:  10/10/2008  DATE OF DISCHARGE:                              HISTORY & PHYSICAL   PROCEDURE:  Right total knee arthroplasty.   CHIEF COMPLAINT:  Right knee pain.   HISTORY OF PRESENT ILLNESS:  A 70 year old female with a history of  right knee pain secondary to osteoarthritis.  This has been refractory  to all conservative treatment.  She does have a recent history of a left  total knee arthroplasty and has done well with this.  This was performed  on September 05, 2008.   PRIMARY CARE PHYSICIAN:  Evelena Peat, M.D.   OTHER PHYSICIANS:  Dr. Phylliss Bob.   PAST MEDICAL HISTORY:  1. Osteoarthritis.  2. Hypertension.   PAST SURGICAL HISTORY:  1. Left total knee replacement in December, 2009.  2. Sinus surgery in the 70s.  3. Cataract surgery in February, 2009.  4. Cataract surgery, March, 2009.   FAMILY HISTORY:  Cancer, dementia, prostate cancer.   SOCIAL HISTORY:  Married.  Primary caregiver will be her husband in the  home.   DRUG ALLERGIES:  PENICILLIN.   MEDICATIONS:  1. Lisinopril 10 mg p.o. daily.  2. Fexofenadine 180 mg p.o. p.r.n.   REVIEW OF SYSTEMS:  DERMATOLOGY:  Highly allergic to fragrances in soaps  and lotions.  She develops rashes.  MUSCULOSKELETAL:  Multiple joint  pain, swelling, muscular pain, back pain, morning stiffness.  Otherwise  see HPI.   PHYSICAL EXAMINATION:  Pulse 72, respirations 16, blood pressure 160/90.  GENERAL:  Awake, alert and oriented.  NECK:  Supple.  No carotid bruits.  CHEST:  Lungs are clear to auscultation bilaterally.  BREASTS:  Deferred.  HEART:  Regular rate and rhythm.  S1 and S2 distinct.  ABDOMEN:  Soft, nontender, nondistended.  Bowel sounds are present.  PELVIS:  Stable.  GENITOURINARY:  Deferred.  EXTREMITIES:  Right knee has a varus deformity.  She comes into full  extension, diffuse anterior tenderness.  SKIN:  No cellulitis.  NEUROLOGIC:  Intact distal sensibilities.   LABS:  Pending pre-surgical testing.   EKG and chest x-ray recently performed for her left knee arthritis.   PLAN OF ACTION:  Right total knee replacement protocol at [facility] on October 10, 2008 by surgeon, Dr. Durene Romans.  Risks and  complications were discussed.   Postoperative medications were provided at today's history and physical,  including aspirin, Robaxin, Ambien, iron, Colace, MiraLax.  Pain  medicines will be dispensed at the time of surgery.     ______________________________  [name], [place]      Madlyn Frankel. Charlann Boxer, M.D.  Electronically Signed    BLM/MEDQ  D:  09/26/2008  T:  09/26/2008  Job:  [id number]   cc:   Evelena Peat, M.D.   Areatha Keas, M.D.  Fax:  [phone] 

## 2011-02-05 NOTE — Op Note (Signed)
NAMECANDELA, KRUL                ACCOUNT NO.:  [id number]   MEDICAL RECORD NO.:  [id number]          PATIENT TYPE:  INP   LOCATION:  1617                         FACILITY:  [facility]   PHYSICIAN:  Madlyn Frankel. Charlann Boxer, M.D.  DATE OF BIRTH:  [birth year]   DATE OF PROCEDURE:  09/05/2008  DATE OF DISCHARGE:                               OPERATIVE REPORT   PREOPERATIVE DIAGNOSIS:  Left knee osteoarthritis.   POSTOPERATIVE DIAGNOSIS:  Left knee osteoarthritis.   PROCEDURE:  Left total knee replacement.  This used DePuy rotating  platform posterior stabilized knee system, size 2-1/2 femur, 2 tibia,  12.5 insert and a 35 patellar button.   SURGEON:  Madlyn Frankel. Charlann Boxer, M.D.   ASSISTANT:  Dwyane Luo, PA-C.   ANESTHESIA:  Duramorph with spinal.   SPECIMENS:  None.   FINDINGS:  Only findings were consistent with osteoarthritis, normal  joint fluid, synovial proliferation.   COMPLICATIONS:  None.   DRAINS:  One Hemovac.   TOURNIQUET TIME:  36 minutes at 250 mmHg.   INDICATIONS FOR PROCEDURE:  Ms. Merkle is a 70 year old female who  presented to the office for bilateral knee pain, left greater than  right.  She had failed conservative measures and wished to proceed with  arthroplasty due to decreased functional quality of life. I reviewed the  surgical procedure, the risks of infection, DVT, component failure, need  for revision for anything including stiffness, the postoperative course  expectations and consent was obtained for benefit of pain relief.   PROCEDURE IN DETAIL:  The patient was brought to the operative theater.  Once adequate anesthesia, preoperative antibiotics and 1 gram of Ancef  administered, the patient was positioned supine with a thigh tourniquet  placed.  The left lower extremity was then pre-scrubbed and prepped and  draped in a sterile fashion.  Following a time-out, identifying the  patient and the extremity, the left lower extremity was exsanguinated  and tourniquet  elevated.  A midline incision was made followed by a  median arthrotomy.  Following initial debridement, attention was  directed to the patella.  Precut measurement was 21 mm.  I resected at  14 mm and used a 35 patellar button restoring the patella height.  Attention was now directed to the femur.  The femoral canal was opened  with a drill and irrigated to prevent fat emboli.  An intramedullary rod  was passed and at 3 degrees of valgus, I resected 10 mm of bone off the  distal femur.   We then attended to the tibia with the tibia subluxated anteriorly and  removed the remaining meniscus and cruciate stumps.  An extramedullary  guide was used and I resected 8 mm of bone off the lateral aspect of the  proximal tibia and noted a slight bit of hyperextension passively at  rest while under anesthesia.  Following this, I debrided medial  osteophytes and sized the tibia to be a size 2.  With an extension  block, it came out to 12.5 to full extension.  At this point, I checked  the cut surface with the 2  tray in place and the cut was perpendicular  in both planes.  At this point, I set the rotation of the femoral guide  based on this proximal tibia cut.  Smooth pins were placed with a size  2.5 on the tibia.  The rotation block was then exchanged for the 2.5, 4-  and-1 cutting block.   The anterior-posterior chamfer cuts were made without difficulty nor  notching.  The box cut was then made based off the lateral aspect of  distal femur.   Tibia was then subluxated anteriorly.  The size 2 tibial tray set nicely  in the cut surface with good cortical contact.  It was pinned into  position to the medial third of the tubercle, drilled and keel punched.  A trial reduction was now carried out with a 2.5 femur, 2 tibia and a  12.5 insert.  The knee came to full extension.  The patella tracked  without application of pressure.   At this point, all trial components were removed.  I injected the   synovial capsule junction with 6 mL of 4% Marcaine with epinephrine and  1 mL of Toradol.  Cement was mixed on the back table as the final  components were opened.  Final components were cemented into position.  The knee was brought to extension with a 12.5 insert.  Extruded cement  was removed.  Once the cement had cured, the excessive cement was  removed throughout the knee.  Once I was satisfied there was no  visualized cement fragment, we placed the final 12.5 insert to match the  2.5 femur.  The knee was re-irrigated with normal saline solution.  The  tourniquet let down at 36 minutes.  The extensor mechanism was then  reapproximated over a closed Hemovac drain with the knee in flexion  using #1 Vicryl.  The remaining wound was closed with 2-0 Vicryl and 4-0  running Monocryl.  The knee was then cleaned, dried and dressed  sterilely with a sterile bulky Jones dressing.  She was brought into the  recovery room in stable condition tolerating the procedure well.      Madlyn Frankel Charlann Boxer, M.D.  Electronically Signed     MDO/MEDQ  D:  09/05/2008  T:  09/05/2008  Job:  [id number]

## 2011-02-05 NOTE — H&P (Signed)
[name], [name]                ACCOUNT NO.:  [id number]   MEDICAL RECORD NO.:  [id number]          PATIENT TYPE:  INP   LOCATION:                               [facility]ACILITY[facility]  [facility]   PHYSICIAN:  [name], M.D.  DATE OF BIRTH:  03-Aug-1941   DATE OF ADMISSION:  09/05/2008  DATE OF DISCHARGE:                              HISTORY & PHYSICAL   PROCEDURE:  A left total knee replacement.   CHIEF COMPLAINT:  Left knee pain.   HISTORY OF PRESENT ILLNESS:  A 70 year old female with a history of left  knee pain secondary to osteoarthritis.  It has been refractory to all  conservative treatment.   PRIMARY CARE PHYSICIAN:  Dr. Evelena Peat.   OTHER PHYSICIANS:  Dr. Phylliss Bob.   PAST MEDICAL HISTORY:  Significant for:   1. Osteoarthritis.  2. Hypertension.   PAST SURGICAL HISTORY:  1. Sinus surgery in the 70s.  2. Cataract surgery in February 2009.  3. Cataract surgery in March 2009.   FAMILY HISTORY:  Cancer, dementia, prostate cancer.   SOCIAL HISTORY:  She is married.  Primary caregiver will be her husband  in the home.   DRUG ALLERGIES:  PENICILLIN.   MEDICATIONS:  1. Lisinopril 10 mg p.o. daily.  2. Fexofenadine 180 mg p.r.n.   REVIEW OF SYSTEMS:  DERMATOLOGY:  She is highly allergic to any  fragrances and soap and lotions.  She develops rashes.  MUSCULOSKELETAL:  She has joint pain, joint swelling, muscular pain, back pain, and  morning stiffness.  Otherwise, see HPI.   PHYSICAL EXAMINATION:  Pulse 72, respirations 16, blood pressure was  170/94.  GENERAL:  Awake, alert, and oriented, well-developed, well-nourished, in  no acute distress.  HEENT:  Normocephalic.  NECK:  Supple.  No carotid bruits.  CHEST/LUNGS:  Clear to auscultation bilaterally.  BREASTS:  Deferred.  HEART:  Regular rate and rhythm.  S1 and S2 distinct.  ABDOMEN:  Soft, nontender, nondistended.  Bowel sounds present.  PELVIS:  Stable.  GENITOURINARY:  Deferred.  EXTREMITIES:  On the left knee  with varus deformity.  She comes up to  full extension.  She has diffuse anterior tenderness.  SKIN:  No cellulitis.  NEUROLOGIC:  Intact distal sensibilities.   LABORATORY DATA:  Labs, EKG, chest x-ray all pending presurgical  testing.   IMPRESSION:  Left knee osteoarthritis.   PLAN OF ACTION:  Left total knee replaced at [facility]  September 05, 2008 by surgeon Dr. Durene Romans.  Risks and complications  were discussed.   Postoperative medications were provided at today's history and physical  including aspirin, Robaxin Ambien, iron, Colace, MiraLax.  Pain  medicines will be provided at time of x-ray.     ______________________________  [name], [name]      [name], M.D.  Electronically Signed    BLM/MEDQ  D:  08/24/2008  T:  08/24/2008  Job:  [id number]   cc:   Evelena Peat, M.D.   Areatha Keas, M.D.  Fax: [phone]

## 2011-02-08 NOTE — Op Note (Signed)
   NAME:  [name], [name]                          ACCOUNT NO.:  [id number]   MEDICAL RECORD NO.:  [id number]                   PATIENT TYPE:  AMB   LOCATION:  ENDO                                 FACILITY:  [facility]   PHYSICIAN:  Anselmo Rod, M.D.               DATE OF BIRTH:  [birth year]   DATE OF PROCEDURE:  07/20/2002  DATE OF DISCHARGE:                                 OPERATIVE REPORT   PROCEDURE:  Screening colonoscopy.   ENDOSCOPIST:  Anselmo Rod, M.D.   INSTRUMENT USED:  Pediatric adjustable Olympus colonoscope.   INDICATION FOR PROCEDURE:  A 70 year old white female with a family history  of ovarian cancer, ovarian screening colonoscopy.  Rule out colonic polyps,  masses, etc.   PREPROCEDURE PREPARATION:  Informed consent was procured from the patient.  The patient was fasted for eight hours prior to the procedure and prepped  with a bottle of magnesium citrate and a gallon of NuLytely the night prior  to the procedure.   PREPROCEDURE PHYSICAL:  VITAL SIGNS:  The patient had stable vital signs.  NECK:  Supple.  CHEST:  Clear to auscultation.  S1, S2 regular.  ABDOMEN:  Soft with normal bowel sounds.   DESCRIPTION OF PROCEDURE:  The patient was placed in the left lateral  decubitus position and sedated with 60 mg of Demerol and 6 mg of Versed.  Once the patient was adequately sedate and maintained on low-flow oxygen and  continuous cardiac monitoring, the Olympus video colonoscope was advanced  from the rectum to the cecum and terminal ileum without difficulty.  The  entire colonic mucosa appeared healthy with a normal vascular pattern except  for a few left-sided diverticula.   IMPRESSION:  1. Normal colonoscopy except for left-sided diverticulosis.  2. Normal-appearing terminal ileum.   RECOMMENDATIONS:  1. A high-fiber diet has been discussed with the patient in great detail,     and brochures have been given to her for her education.  2.     Repeat  colorectal cancer screening is recommended in the next 10 years     unless the patient develops any abnormal symptoms in the interim.  3. Outpatient follow-up on a p.r.n. basis.                                               Anselmo Rod, M.D.    JNM/MEDQ  D:  07/20/2002  T:  07/20/2002  Job:  [id number]   cc:   Gaetano Hawthorne. Lily Peer, M.D.  [address]  [place]  [place] [address]  Fax: [phone]

## 2011-02-08 NOTE — Discharge Summary (Signed)
[name], [name]                ACCOUNT NO.:  [id number]   MEDICAL RECORD NO.:  [id number]          PATIENT TYPE:  INP   LOCATION:  1615                         [facility]AC[facility]   PHYSICIAN:  Madlyn Frankel. Charlann Boxer, M.D.  DATE OF BIRTH:  1941-08-13   DATE OF ADMISSION:  10/10/2008  DATE OF DISCHARGE:  10/12/2008                               DISCHARGE SUMMARY   ADMITTING DIAGNOSES:  1. Osteoarthritis.  2. Hypertension.   DISCHARGE DIAGNOSES:  1. Osteoarthritis.  2. Hypertension.   HISTORY OF PRESENT ILLNESS:  A 70 year old female with a history of  right knee pain secondary to osteoarthritis refractory to all  conservative treatment.  Recent history of left total knee replacement.  Has done well.   CONSULTATIONS:  None.   PROCEDURE:  Right total knee replacement.   SURGEON:  Dr. Durene Romans.   ASSISTANT:  Dwyane Luo, PA-C.   LABORATORY DATA:  CBC:  White blood cells 7.9, hemoglobin 9.7,  hematocrit 28.5, MCV 183.6, platelets 227.  White cell differential all  within normal limits.  Coags normal.  Metabolic panel:  Sodium 138,  potassium 4.1, glucose 104, BUN 8, creatinine 0.43.  Calcium was 9.1.  UA was negative.   HOSPITAL COURSE:  The patient underwent right total knee placement and  admitted to the orthopedic floor.  Her stay was unremarkable.  She  remained hemodynamically and orthopedically stable throughout her course  of stay.  She had little to no nausea or vomiting as she previously had.  They utilized a scopolamine patch, which helped significantly.  She was  weightbearing as tolerated.  Made adequate progress with physical  therapy while in the hospital for discharge to home.  Her dressing was  changed.  The wound looked great with no sign of drainage.   DISCHARGE DISPOSITION:  Discharged home in stable improved condition  with home health care and PT.   DISCHARGE PHYSICAL THERAPY:  Weightbearing as tolerated with a rolling  walker.   DISCHARGE DIET:  Heart  healthy.   DISCHARGE WOUND CARE:  Keep dry.   DISCHARGE MEDICATIONS:  1. Aspirin 325 mg 1 p.o. b.i.d. x6 weeks.  2. Robaxin 500 mg 1 p.o. q. 6.  3. Iron 325 mg 1 p.o. t.i.d. x3 weeks.  4. Colace 100 mg 1 p.o. b.i.d.  5. MiraLax 17 grams 1 p.o. daily.  6. Norco 5/325 one to two p.o. q. 4-6 p.r.n. pain.  7. Lisinopril HCTZ 20/12.5 one p.o. q.a.m.  8. Ambien 10 mg 1 p.o. q.h.s.  9. Darvocet 1-2 p.o. q. 4-6.  May substitute instead of taking Norco.  10.Multivitamin daily.   DISCHARGE FOLLOWUP:  Follow up with Dr. Charlann Boxer at phone number [phone] in  2 weeks for wound check.     ______________________________  Yetta Glassman. [address], [place]      Madlyn Frankel. Charlann Boxer, M.D.  Electronically Signed    BLM/MEDQ  D:  11/01/2008  T:  11/01/2008  Job:  [id number]   cc:   Evelena Peat, M.D.   Areatha Keas, M.D.  Fax: [phone]

## 2011-02-25 ENCOUNTER — Other Ambulatory Visit: Payer: Self-pay | Admitting: Family Medicine

## 2011-05-29 ENCOUNTER — Ambulatory Visit (INDEPENDENT_AMBULATORY_CARE_PROVIDER_SITE_OTHER): Payer: Medicare Other | Admitting: Family Medicine

## 2011-05-29 ENCOUNTER — Encounter: Payer: Self-pay | Admitting: Family Medicine

## 2011-05-29 DIAGNOSIS — Z1211 Encounter for screening for malignant neoplasm of colon: Secondary | ICD-10-CM

## 2011-05-29 DIAGNOSIS — R109 Unspecified abdominal pain: Secondary | ICD-10-CM

## 2011-05-29 LAB — POCT URINALYSIS DIPSTICK
Bilirubin, UA: NEGATIVE
Ketones, UA: NEGATIVE
Nitrite, UA: NEGATIVE
pH, UA: 7.5

## 2011-05-29 LAB — CBC WITH DIFFERENTIAL/PLATELET
Basophils Absolute: 0 10*3/uL (ref 0.0–0.1)
Eosinophils Relative: 1.4 % (ref 0.0–5.0)
HCT: 42.1 % (ref 36.0–46.0)
Hemoglobin: 13.9 g/dL (ref 12.0–15.0)
Lymphocytes Relative: 35.7 % (ref 12.0–46.0)
Monocytes Relative: 12.7 % — ABNORMAL HIGH (ref 3.0–12.0)
Platelets: 239 10*3/uL (ref 150.0–400.0)
RDW: 14 % (ref 11.5–14.6)
WBC: 4.7 10*3/uL (ref 4.5–10.5)

## 2011-05-29 NOTE — Progress Notes (Signed)
  Subjective:    Patient ID: [name], female    DOB: 11-27-1940, 70 y.o.   MRN: [id number]  HPI Abdominal pain. Onset 5 days ago. Diffuse bilateral lower abdomen. Quality pain was sharp and relatively constant. Saturday pain seemed to reach its peak and she took hydrocodone which helped. She did have some constipation. No diarrhea. No bloody stools. Denies any recent fever, nausea, or vomiting. Sunday night took stool softener and since yesterday the pain has steadily improved and is basically resolved today. No recent dysuria. No recent appetite or weight changes. Colonoscopy per patient about 8 years ago. No known history of diverticular disease.  Sister diagnosed with colon cancer within the past year.  Recently discontinued VESIcare about one week ago stating this was not helping her urine incontinence. No other constipating medications.  Patient had recent knee replacement and has done well since then. Ambulating with much less pain.  Past Medical History  Diagnosis Date  . Hypertension   . Osteoporosis   . Arthritis    Past Surgical History  Procedure Date  . Joint replacement 2009, 2010    knee replacements    reports that she quit smoking about 23 years ago. She does not have any smokeless tobacco history on file. Her alcohol and drug histories not on file. family history includes Arthritis in her father and Cancer in her father. Allergies  Allergen Reactions  . Alendronate Sodium     REACTION: constipation  . Celecoxib     REACTION: GI upset  . Penicillins     REACTION: serious dehydration, hospitalized in 1990      Review of Systems  Constitutional: Negative for fever, chills and unexpected weight change.  HENT: Negative for trouble swallowing.   Respiratory: Negative for cough and shortness of breath.   Cardiovascular: Negative for chest pain.  Gastrointestinal: Positive for abdominal pain and constipation. Negative for nausea, vomiting, diarrhea, blood in  stool and abdominal distention.  Genitourinary: Negative for dysuria.       Objective:   Physical Exam  Constitutional: She appears well-developed and well-nourished. No distress.  HENT:  Mouth/Throat: Oropharynx is clear and moist.  Neck: Neck supple.  Cardiovascular: Normal rate, regular rhythm and normal heart sounds.   Pulmonary/Chest: Effort normal and breath sounds normal. No respiratory distress. She has no wheezes. She has no rales.  Abdominal: Soft. Bowel sounds are normal. She exhibits no distension and no mass. There is no rebound and no guarding.       Very minimal tenderness right and left lower quadrants with no guarding or rebound  Genitourinary: Guaiac negative stool.       No rectal mass.  Couple of external skin tags. Stool brown and heme-negative. No impaction  Lymphadenopathy:    She has no cervical adenopathy.          Assessment & Plan:  Abdominal pain. Diffuse and lower bilateral. Suspect related to constipation. Symptoms improved at this time. Doubt acute diverticulitis. Check a urinalysis and CBC. Measures to reduce constipation discussed.  Leave off VESIcare at this time.  Patient due for repeat colonoscopy. Sister with colon cancer

## 2011-05-29 NOTE — Patient Instructions (Signed)

## 2011-05-29 NOTE — Progress Notes (Signed)
Quick Note:  Pt informed ______ 

## 2011-06-03 ENCOUNTER — Ambulatory Visit (AMBULATORY_SURGERY_CENTER): Payer: Medicare Other | Admitting: *Deleted

## 2011-06-03 VITALS — Ht <= 58 in | Wt 153.1 lb

## 2011-06-03 DIAGNOSIS — Z1211 Encounter for screening for malignant neoplasm of colon: Secondary | ICD-10-CM

## 2011-06-03 MED ORDER — SUPREP BOWEL PREP KIT 17.5-3.13-1.6 GM/177ML PO SOLN: ORAL | Status: DC

## 2011-06-17 ENCOUNTER — Ambulatory Visit (AMBULATORY_SURGERY_CENTER): Payer: Medicare Other | Admitting: Internal Medicine

## 2011-06-17 ENCOUNTER — Encounter: Payer: Self-pay | Admitting: Internal Medicine

## 2011-06-17 VITALS — BP 147/68 | HR 90 | Temp 98.1°F | Resp 18 | Ht <= 58 in | Wt 153.0 lb

## 2011-06-17 DIAGNOSIS — K5730 Diverticulosis of large intestine without perforation or abscess without bleeding: Secondary | ICD-10-CM

## 2011-06-17 DIAGNOSIS — Z1211 Encounter for screening for malignant neoplasm of colon: Secondary | ICD-10-CM

## 2011-06-17 MED ORDER — SODIUM CHLORIDE 0.9 % IV SOLN: 500.0000 mL | INTRAVENOUS | Status: DC

## 2011-06-17 NOTE — Patient Instructions (Signed)
See the picture page for your findings from your exam today.  Follow the green and blue discharge instruction sheets the rest of the day.  Resume your prior medications today. Please call if any questions or concerns.  

## 2011-06-17 NOTE — Progress Notes (Signed)
I assisted the pt with dressing.  I sent her son to get the car.  No complaints in the recovery room or on discharge. MAW

## 2011-06-18 ENCOUNTER — Telehealth: Payer: Self-pay | Admitting: *Deleted

## 2011-06-18 NOTE — Telephone Encounter (Signed)

## 2011-06-28 LAB — URINALYSIS, ROUTINE W REFLEX MICROSCOPIC
Ketones, ur: NEGATIVE mg/dL
Nitrite: NEGATIVE
Protein, ur: NEGATIVE mg/dL
Urobilinogen, UA: 0.2 mg/dL (ref 0.0–1.0)

## 2011-06-28 LAB — CBC
HCT: 31.8 % — ABNORMAL LOW (ref 36.0–46.0)
HCT: 32.6 % — ABNORMAL LOW (ref 36.0–46.0)
Hemoglobin: 10.7 g/dL — ABNORMAL LOW (ref 12.0–15.0)
MCHC: 34.4 g/dL (ref 30.0–36.0)
MCV: 100.6 fL — ABNORMAL HIGH (ref 78.0–100.0)
MCV: 99.9 fL (ref 78.0–100.0)
Platelets: 169 10*3/uL (ref 150–400)
Platelets: 185 10*3/uL (ref 150–400)
RDW: 15.1 % (ref 11.5–15.5)
WBC: 8.6 10*3/uL (ref 4.0–10.5)
WBC: 9.2 10*3/uL (ref 4.0–10.5)

## 2011-06-28 LAB — PROTIME-INR: Prothrombin Time: 12.1 seconds (ref 11.6–15.2)

## 2011-06-28 LAB — APTT: aPTT: 32 seconds (ref 24–37)

## 2011-06-28 LAB — BASIC METABOLIC PANEL
BUN: 4 mg/dL — ABNORMAL LOW (ref 6–23)
BUN: 6 mg/dL (ref 6–23)
Chloride: 100 mEq/L (ref 96–112)
Chloride: 101 mEq/L (ref 96–112)
Creatinine, Ser: 0.41 mg/dL (ref 0.4–1.2)
Glucose, Bld: 117 mg/dL — ABNORMAL HIGH (ref 70–99)
Glucose, Bld: 121 mg/dL — ABNORMAL HIGH (ref 70–99)
Potassium: 3.3 mEq/L — ABNORMAL LOW (ref 3.5–5.1)
Potassium: 3.9 mEq/L (ref 3.5–5.1)
Sodium: 131 mEq/L — ABNORMAL LOW (ref 135–145)

## 2011-06-28 LAB — TYPE AND SCREEN: ABO/RH(D): A POS

## 2011-07-17 ENCOUNTER — Ambulatory Visit (INDEPENDENT_AMBULATORY_CARE_PROVIDER_SITE_OTHER): Payer: Medicare Other

## 2011-07-17 DIAGNOSIS — Z23 Encounter for immunization: Secondary | ICD-10-CM

## 2011-12-31 ENCOUNTER — Other Ambulatory Visit: Payer: Self-pay | Admitting: Family Medicine

## 2012-05-19 LAB — HM MAMMOGRAPHY

## 2012-05-19 LAB — HM DEXA SCAN

## 2012-05-21 ENCOUNTER — Encounter: Payer: Self-pay | Admitting: Family Medicine

## 2012-05-21 ENCOUNTER — Ambulatory Visit (INDEPENDENT_AMBULATORY_CARE_PROVIDER_SITE_OTHER): Payer: Medicare Other | Admitting: Family Medicine

## 2012-05-21 VITALS — BP 132/82 | HR 80 | Temp 97.6°F | Resp 12 | Ht <= 58 in | Wt 151.0 lb

## 2012-05-21 DIAGNOSIS — M810 Age-related osteoporosis without current pathological fracture: Secondary | ICD-10-CM

## 2012-05-21 DIAGNOSIS — N393 Stress incontinence (female) (male): Secondary | ICD-10-CM

## 2012-05-21 DIAGNOSIS — I10 Essential (primary) hypertension: Secondary | ICD-10-CM

## 2012-05-21 DIAGNOSIS — Z0000 Encounter for general adult medical examination without abnormal findings: Secondary | ICD-10-CM

## 2012-05-21 DIAGNOSIS — E785 Hyperlipidemia, unspecified: Secondary | ICD-10-CM

## 2012-05-21 LAB — BASIC METABOLIC PANEL
BUN: 15 mg/dL (ref 6–23)
CO2: 29 mEq/L (ref 19–32)
Calcium: 10 mg/dL (ref 8.4–10.5)
Creatinine, Ser: 0.7 mg/dL (ref 0.4–1.2)
GFR: 82.04 mL/min (ref >60.00)
Glucose, Bld: 90 mg/dL (ref 70–99)
Sodium: 137 mEq/L (ref 135–145)

## 2012-05-21 LAB — LIPID PANEL
HDL: 53.7 mg/dL (ref >39.00)
Total CHOL/HDL Ratio: 4

## 2012-05-21 NOTE — Progress Notes (Signed)
Subjective:    Patient ID: [name], female    DOB: 12/03/1940, 71 y.o.   MRN: [id number]  HPI  Patient for Medicare wellness exam and medical followup.  History of hypertension, mild hyperlipidemia, osteoporosis, allergic rhinitis and urine incontinence. She remains on Prinzide for hypertension. Blood pressure well controlled. No orthostasis. Compliant with therapy.  Immunizations up-to-date with the exception of no history of shingles vaccine. She had clinical outbreak of shingles several years ago.  Patient's had some recent problems with progressive stress incontinence. Couple years ago treated with VESIcare for some urgency but had severe constipation. Her symptoms now seem to be more stress incontinence related. She has almost daily leakage with changing positions or things like cough or sneeze. This is becoming more of a social problem with getting out. She is interested in considering urology evaluation. No recent burning with urination.  Patient's husband passed away within the past year from complications of carcinoid tumor. Overall she is coping fairly well. Also she had sister that died of colon cancer complications recently. Patient had colonoscopy last year.  She had mammogram and DEXA scan last week and those reports are pending.  Past Medical History  Diagnosis Date  . Hypertension   . Osteoporosis   . Arthritis   . Cataract    Past Surgical History  Procedure Date  . Joint replacement 2009, 2010    knee replacements  . Nasal sinus surgery   . Vocal cord polyps     REMOVED  . Cataracts   . Colonoscopy 06/2002  . Bunionectomy     reports that she quit smoking about 24 years ago. She has never used smokeless tobacco. She reports that she does not drink alcohol or use illicit drugs. family history includes Arthritis in her father; Cancer in her father; and Colon cancer (age of onset:77) in her sister. Allergies  Allergen Reactions  . Alendronate Sodium    REACTION: constipation  . Celecoxib     REACTION: GI upset  . Penicillins     REACTION: serious dehydration, hospitalized in 1990   1.  Risk factors based on Past Medical , Social, and Family history as above 2.  Limitations in physical activities low risk for fall 3.  Depression/mood no depression or anxiety issues 4.  Hearing no major deficits 5.  ADLs independent in all 6.  Cognitive function (orientation to time and place, language, writing, speech,memory) no memory deficits. Judgment intact. Language intact 7.  Home Safety no issues 8.  Height, weight, and visual acuity. She has cataracts and has upcoming cataract surgery next month. Height and weight stable 9.  Counseling discussed coping strategies for loss was rales. She has good support network 10. Recommendation of preventive services. Check him coverture shingles vaccine. Continue yearly mammogram. Continue yearly flu vaccine 11. Labs based on risk factors basic metabolic panel and lipid panel 12. Care Plan as above     Review of Systems  Constitutional: Negative for fever, activity change, appetite change, fatigue and unexpected weight change.  HENT: Negative for hearing loss, ear pain, sore throat and trouble swallowing.   Eyes: Negative for visual disturbance.  Respiratory: Negative for cough and shortness of breath.   Cardiovascular: Negative for chest pain and palpitations.  Gastrointestinal: Negative for abdominal pain, diarrhea, constipation and blood in stool.  Genitourinary: Positive for frequency. Negative for dysuria and hematuria.  Musculoskeletal: Negative for myalgias, back pain and arthralgias.  Skin: Negative for rash.  Neurological: Negative for dizziness, syncope and  headaches.  Hematological: Negative for adenopathy.  Psychiatric/Behavioral: Negative for confusion and dysphoric mood.       Objective:   Physical Exam  Constitutional: She is oriented to person, place, and time. She appears  well-developed and well-nourished.  HENT:  Head: Normocephalic and atraumatic.  Eyes: EOM are normal. Pupils are equal, round, and reactive to light.  Neck: Normal range of motion. Neck supple. No thyromegaly present.  Cardiovascular: Normal rate, regular rhythm and normal heart sounds.   No murmur heard. Pulmonary/Chest: Breath sounds normal. No respiratory distress. She has no wheezes. She has no rales.  Abdominal: Soft. Bowel sounds are normal. She exhibits no distension and no mass. There is no tenderness. There is no rebound and no guarding.  Musculoskeletal: Normal range of motion. She exhibits no edema.  Lymphadenopathy:    She has no cervical adenopathy.  Neurological: She is alert and oriented to person, place, and time. She displays normal reflexes. No cranial nerve deficit.  Skin: No rash noted.  Psychiatric: She has a normal mood and affect. Her behavior is normal. Judgment and thought content normal.          Assessment & Plan:  #1 health maintenance. Check coverage for shingles vaccine. Continue yearly flu vaccine. Continue yearly mammograms. #2 hypertension. Stable. Check basic metabolic panel  #3 stress urine incontinence. Urology referral. Patient has tried Kegal exercises in the past without much success #4 hx mild hyperlipidemia.  Repeat lipids.

## 2012-05-21 NOTE — Patient Instructions (Addendum)
Continue yearly flu vaccine. Continue yearly mammogram Continue regular supplementation with calcium and vitamin D

## 2012-05-22 ENCOUNTER — Encounter: Payer: Self-pay | Admitting: Family Medicine

## 2012-05-26 ENCOUNTER — Telehealth: Payer: Self-pay | Admitting: *Deleted

## 2012-05-26 NOTE — Telephone Encounter (Signed)
Following Dexa, pt was instructed to begin Calcium and Vit D.  She purchased the petite version of Caltrate 500 mg Vit D and 400 mg calcium, 2 tabs twice daily.  She tried this for 2 days and developed swollen abd, terrible gas with pain, so she quit taking.  Pt also tried Fosamax a few years ago and same thing happened.  Any other suggestions?

## 2012-05-26 NOTE — Telephone Encounter (Signed)
Consider OTC Tums for calcium if tolerated.  If unable to tolerate Fosamax, she will unlikely tolerate other bisphosphonates.  Other osteoporosis options would be Prolia or Reclast (both are injectables). Let me know if she is interested in considering injectable therapy.  Reclast is once yearly and Prolia would be every 6 months.

## 2012-05-26 NOTE — Progress Notes (Signed)
Quick Note:  Pt informed ______ 

## 2012-05-28 NOTE — Telephone Encounter (Signed)
Pt informed, she will begin taking tums again, however is reluctant to do the injectables as she does not want to feel sick again.  Pt wants to discuss injectables at another appt sometime.  FYI only

## 2012-06-04 ENCOUNTER — Ambulatory Visit (INDEPENDENT_AMBULATORY_CARE_PROVIDER_SITE_OTHER): Payer: Medicare Other | Admitting: Family Medicine

## 2012-06-04 DIAGNOSIS — Z23 Encounter for immunization: Secondary | ICD-10-CM

## 2012-06-04 DIAGNOSIS — Z299 Encounter for prophylactic measures, unspecified: Secondary | ICD-10-CM

## 2012-06-04 MED ORDER — ZOSTER VACCINE LIVE 19400 UNT/0.65ML ~~LOC~~ SOLR: 0.6500 mL | Freq: Once | SUBCUTANEOUS | Status: DC

## 2012-09-24 ENCOUNTER — Encounter: Payer: Self-pay | Admitting: Family Medicine

## 2012-09-24 ENCOUNTER — Ambulatory Visit (INDEPENDENT_AMBULATORY_CARE_PROVIDER_SITE_OTHER): Payer: PRIVATE HEALTH INSURANCE | Admitting: Family Medicine

## 2012-09-24 VITALS — BP 150/92 | Temp 98.5°F | Wt 154.0 lb

## 2012-09-24 DIAGNOSIS — R05 Cough: Secondary | ICD-10-CM

## 2012-09-24 DIAGNOSIS — R110 Nausea: Secondary | ICD-10-CM

## 2012-09-24 DIAGNOSIS — R059 Cough, unspecified: Secondary | ICD-10-CM

## 2012-09-24 MED ORDER — LEVOFLOXACIN 500 MG PO TABS: 500.0000 mg | ORAL_TABLET | Freq: Every day | ORAL | Status: DC

## 2012-09-24 MED ORDER — PROMETHAZINE HCL 25 MG/ML IJ SOLN
25.0000 mg | Freq: Once | INTRAMUSCULAR | Status: AC
  Administered 2012-09-24: 25 mg via INTRAMUSCULAR

## 2012-09-24 MED ORDER — ONDANSETRON HCL 8 MG PO TABS: 8.0000 mg | ORAL_TABLET | Freq: Three times a day (TID) | ORAL | Status: DC | PRN

## 2012-09-24 NOTE — Patient Instructions (Addendum)
Drink plenty of fluids. Followup immediately for any recurrent vomiting, increased shortness of breath, confusion, or any other problems

## 2012-09-24 NOTE — Progress Notes (Signed)
  Subjective:    Patient ID: [name], female    DOB: Nov 11, 1940, 72 y.o.   MRN: [id number]  HPI Acute visit. Patient was seen with onset URI symptoms about one week ago.  Started with more typical cold-like symptoms. Sore throat, cough, and nasal congestion. Over the past couple days she's had progressive cough, intermittent headache, and increased malaise. No dyspnea at rest. Former smoker. She's also developed some nausea today without vomiting. She is still drinking plenty of fluids. Denies any abdominal pain or dysuria. No diarrhea. She's taken over-the-counter Mucinex.  Chronic problems include history of osteoporosis, hyperlipidemia, and hypertension  Past Medical History  Diagnosis Date  . Hypertension   . Osteoporosis   . Arthritis   . Cataract    Past Surgical History  Procedure Date  . Joint replacement 2009, 2010    knee replacements  . Nasal sinus surgery   . Vocal cord polyps     REMOVED  . Cataracts   . Colonoscopy 06/2002  . Bunionectomy     reports that she quit smoking about 24 years ago. She has never used smokeless tobacco. She reports that she does not drink alcohol or use illicit drugs. family history includes Arthritis in her father; Cancer in her father; and Colon cancer (age of onset:77) in her sister. Allergies  Allergen Reactions  . Alendronate Sodium     REACTION: constipation  . Celecoxib     REACTION: GI upset  . Penicillins     REACTION: serious dehydration, hospitalized in 1990      Review of Systems  Constitutional: Positive for chills, appetite change and fatigue. Negative for fever.  HENT: Positive for congestion and sore throat.   Respiratory: Positive for cough. Negative for wheezing.   Cardiovascular: Negative for chest pain.  Gastrointestinal: Positive for nausea. Negative for vomiting and abdominal pain.  Genitourinary: Negative for dysuria.  Neurological: Positive for headaches. Negative for dizziness.         Objective:   Physical Exam  Constitutional: She is oriented to person, place, and time. She appears well-developed and well-nourished.  HENT:  Right Ear: External ear normal.  Left Ear: External ear normal.  Mouth/Throat: Oropharynx is clear and moist.  Neck: Neck supple.  Cardiovascular: Normal rate and regular rhythm.        Patient has some coarse breath sounds left base and questionable faint crackles  Pulmonary/Chest: Effort normal.  Abdominal: Soft. There is no tenderness.  Musculoskeletal: She exhibits no edema.  Lymphadenopathy:    She has no cervical adenopathy.  Neurological: She is alert and oriented to person, place, and time.          Assessment & Plan:  Acute respiratory illness. Patient has cough and question of some faint rales left base. No fever at this time. Pulse oximetry of 90% and no chronic lung disease. Question early pneumonia. Phenergan 25 mg IM given for nausea. Check chest x-ray. Levaquin 500 milligrams once daily for 10 days. Patient does not have significant tachycardia, tachypnea, or dehydration. No pleuritic pain or hemoptysis.

## 2012-09-25 ENCOUNTER — Ambulatory Visit (INDEPENDENT_AMBULATORY_CARE_PROVIDER_SITE_OTHER): Payer: PRIVATE HEALTH INSURANCE | Admitting: Family Medicine

## 2012-09-25 ENCOUNTER — Encounter: Payer: Self-pay | Admitting: Family Medicine

## 2012-09-25 ENCOUNTER — Ambulatory Visit (INDEPENDENT_AMBULATORY_CARE_PROVIDER_SITE_OTHER)
Admission: RE | Admit: 2012-09-25 | Discharge: 2012-09-25 | Disposition: A | Discharge status: Home / Self Care | Payer: PRIVATE HEALTH INSURANCE | Source: Ambulatory Visit | Attending: Family Medicine | Admitting: Family Medicine

## 2012-09-25 VITALS — BP 120/70 | HR 96 | Temp 97.9°F

## 2012-09-25 DIAGNOSIS — R05 Cough: Secondary | ICD-10-CM

## 2012-09-25 DIAGNOSIS — R059 Cough, unspecified: Secondary | ICD-10-CM

## 2012-09-25 NOTE — Progress Notes (Signed)
  Subjective:    Patient ID: [name], female    DOB: 06/17/1941, 72 y.o.   MRN: [id number]  HPI Followup acute respiratory illness. Refer to note yesterday. Patient had over one week history of typical viral-type symptoms but also had progressive cough, malaise, and nausea. We heard some crackles left base raising concern for possible early pneumonia. Patient was started on Levaquin and Zofran for nausea. She feels better overall today. X-ray prior to arrival here revealed some question of atelectasis left base but no definite pneumonia. No effusion. Nausea is improved on Zofran. She is drinking fluids and has been eating some today. She's still coughing with productive cough but overall feels better. No pleuritic pain  Past Medical History  Diagnosis Date  . Hypertension   . Osteoporosis   . Arthritis   . Cataract    Past Surgical History  Procedure Date  . Joint replacement 2009, 2010    knee replacements  . Nasal sinus surgery   . Vocal cord polyps     REMOVED  . Cataracts   . Colonoscopy 06/2002  . Bunionectomy     reports that she quit smoking about 24 years ago. She has never used smokeless tobacco. She reports that she does not drink alcohol or use illicit drugs. family history includes Arthritis in her father; Cancer in her father; and Colon cancer (age of onset:77) in her sister. Allergies  Allergen Reactions  . Alendronate Sodium     REACTION: constipation  . Celecoxib     REACTION: GI upset  . Penicillins     REACTION: serious dehydration, hospitalized in 1990      Review of Systems  Constitutional: Positive for fatigue. Negative for fever and chills.  HENT: Positive for congestion.   Respiratory: Positive for cough. Negative for shortness of breath and wheezing.   Gastrointestinal: Negative for vomiting.       Objective:   Physical Exam  Constitutional: She appears well-developed and well-nourished.  HENT:  Mouth/Throat: Oropharynx is clear and  moist.  Neck: Neck supple.  Cardiovascular: Normal rate and regular rhythm.   Pulmonary/Chest: Effort normal. No respiratory distress. She has no wheezes.       Patient still has a few faint crackles left base  Lymphadenopathy:    She has no cervical adenopathy.          Assessment & Plan:  Cough. Overall improved. No definite pneumonia by chest x-ray. Finish out Levaquin.

## 2012-09-25 NOTE — Patient Instructions (Addendum)
Drink plenty of fluids Follow up for any recurrent fever, shortness of breath, or recurrent vomiting.

## 2012-10-01 ENCOUNTER — Telehealth: Payer: Self-pay | Admitting: Family Medicine

## 2012-10-01 NOTE — Telephone Encounter (Signed)
She can try over-the-counter Sudafed but be cautious with watching blood pressure. There is no prescription decongestant that does not have the potential to raise her blood pressure.  I feel short-term use for the next few days over-the-counter Sudafed should be fine

## 2012-10-01 NOTE — Telephone Encounter (Signed)
Pt informed and voiced understanding

## 2012-10-01 NOTE — Telephone Encounter (Signed)
[facility] Triage Call Report Triage Record Num: [id number] Operator: Jeraldine Loots Patient Name: Yuette Putnam Call Date & Time: 09/30/2012 5:32:29PM Patient Phone: [phone] PCP: Evelena Peat Patient Gender: Female PCP Fax : [phone] Patient DOB: 1941-04-24 Practice Name[facility] Reason for Call: Patient calling, is on medication for pneumonia but has increased nasal congestion and a h/a. She is asking for prescription decongestant. Triaged per URI. Suggested warm compresses across her nose and cheek area. Please ask MD about medication prior to scheduling appt. (pt request). Protocol(s) Used: Upper Respiratory Infection (URI) Recommended Outcome per Protocol: See Provider within 24 hours Reason for Outcome: Mild to moderate headache for more than 24 hours unrelieved with nonprescription medications Care Advice: ~ SYMPTOM / CONDITION MANAGEMENT A warm, moist compress placed on face, over eyes for 15 to 20 minutes, 5 to 6 times a day, may help relieve the congestion. ~ Analgesic/Antipyretic Advice - NSAIDs: Consider aspirin, ibuprofen, naproxen or ketoprofen for pain or fever as directed on label or by pharmacist/provider. PRECAUTIONS: - If over 48 years of age, should not take longer than 1 week without consulting provider. EXCEPTIONS: - Should not be used if taking blood thinners or have bleeding problems. - Do not use if have history of sensitivity/allergy to any of these medications; or history of cardiovascular, ulcer, kidney, liver disease or diabetes unless approved by provider. - Do not exceed recommended dose or frequency. ~ 09/30/2012 5:39:07PM Page 1 of 1 [id number]

## 2012-10-10 ENCOUNTER — Other Ambulatory Visit: Payer: Self-pay | Admitting: Family Medicine

## 2013-01-21 ENCOUNTER — Other Ambulatory Visit: Payer: Self-pay | Admitting: Dermatology

## 2013-07-07 ENCOUNTER — Ambulatory Visit (INDEPENDENT_AMBULATORY_CARE_PROVIDER_SITE_OTHER): Payer: Medicare Other

## 2013-07-07 DIAGNOSIS — Z23 Encounter for immunization: Secondary | ICD-10-CM

## 2013-07-28 ENCOUNTER — Encounter: Payer: PRIVATE HEALTH INSURANCE | Admitting: Family Medicine

## 2013-08-10 ENCOUNTER — Encounter: Payer: Self-pay | Admitting: Family Medicine

## 2013-08-10 ENCOUNTER — Ambulatory Visit (INDEPENDENT_AMBULATORY_CARE_PROVIDER_SITE_OTHER): Payer: Medicare Other | Admitting: Family Medicine

## 2013-08-10 VITALS — BP 120/70 | HR 75 | Temp 97.7°F | Ht <= 58 in | Wt 152.0 lb

## 2013-08-10 DIAGNOSIS — Z0000 Encounter for general adult medical examination without abnormal findings: Secondary | ICD-10-CM

## 2013-08-10 DIAGNOSIS — E785 Hyperlipidemia, unspecified: Secondary | ICD-10-CM

## 2013-08-10 DIAGNOSIS — I10 Essential (primary) hypertension: Secondary | ICD-10-CM

## 2013-08-10 DIAGNOSIS — E669 Obesity, unspecified: Secondary | ICD-10-CM | POA: Insufficient documentation

## 2013-08-10 DIAGNOSIS — J019 Acute sinusitis, unspecified: Secondary | ICD-10-CM

## 2013-08-10 LAB — LDL CHOLESTEROL, DIRECT: Direct LDL: 147.6 mg/dL

## 2013-08-10 LAB — BASIC METABOLIC PANEL
CO2: 30 mEq/L (ref 19–32)
Chloride: 100 mEq/L (ref 96–112)
Creatinine, Ser: 0.6 mg/dL (ref 0.4–1.2)
Potassium: 3.4 mEq/L — ABNORMAL LOW (ref 3.5–5.1)
Sodium: 137 mEq/L (ref 135–145)

## 2013-08-10 LAB — HEPATIC FUNCTION PANEL
ALT: 20 U/L (ref 0–35)
Alkaline Phosphatase: 63 U/L (ref 39–117)
Bilirubin, Direct: 0.1 mg/dL (ref 0.0–0.3)
Total Bilirubin: 0.6 mg/dL (ref 0.3–1.2)
Total Protein: 7 g/dL (ref 6.0–8.3)

## 2013-08-10 LAB — LIPID PANEL
Total CHOL/HDL Ratio: 4
Triglycerides: 101 mg/dL (ref 0.0–149.0)

## 2013-08-10 MED ORDER — AZITHROMYCIN 250 MG PO TABS: ORAL_TABLET | ORAL | Status: AC

## 2013-08-10 MED ORDER — LISINOPRIL-HYDROCHLOROTHIAZIDE 20-12.5 MG PO TABS: 1.0000 | ORAL_TABLET | Freq: Every day | ORAL | Status: DC

## 2013-08-10 NOTE — Progress Notes (Signed)
Subjective:    Patient ID: [name], female    DOB: 10/04/1940, 72 y.o.   MRN: [id number]  HPI  Patient is seen for Medicare wellness exam and medical followup She has history of hypertension treated with lisinopril HCTZ. Blood pressure well-controlled. No orthostasis. Compliant with therapy. She has history of mild hyperlipidemia which has not been treated with medication. She's had some ongoing problems with urinary incontinence and has seen urologist and during the past year actually saw acupuncture specialist which has helped significantly.  Her immunizations are up-to-date. Colonoscopy 2 years ago. She has decided against yearly mammograms and has decided [name] other year. Last Pap smear around age 61 and no history of abnormality and she has decided against any further Pap smears.  Pt also complains of progressive frontal sinus pressure and colored nasal discharge for almost 2 weeks.  No fever.  Mild headaches. No major cough.  Not relieved with OTC meds.  Past Medical History  Diagnosis Date  . Hypertension   . Osteoporosis   . Arthritis   . Cataract    Past Surgical History  Procedure Laterality Date  . Joint replacement  2009, 2010    knee replacements  . Nasal sinus surgery    . Vocal cord polyps      REMOVED  . Cataracts    . Colonoscopy  06/2002  . Bunionectomy      reports that she quit smoking about 25 years ago. She has never used smokeless tobacco. She reports that she does not drink alcohol or use illicit drugs. family history includes Arthritis in her father; Cancer in her father; Colon cancer (age of onset: 32) in her sister. Allergies  Allergen Reactions  . Alendronate Sodium     REACTION: constipation  . Celecoxib     REACTION: GI upset  . Penicillins     REACTION: serious dehydration, hospitalized in 1990   1.  Risk factors based on Past Medical , Social, and Family history reviewed and as above 2.  Limitations in physical activities no  recent falls. No consistent exercise 3.  Depression/mood no depression or anxiety issues 4.  Hearing intact with no recent changes 5.  ADLs independent in all 6.  Cognitive function (orientation to time and place, language, writing, speech,memory) memory intact. Language and judgment intact 7.  Home Safety no issues 8.  Height, weight, and visual acuity. All stable 9.  Counseling discuss adequate exercise and fall risk reduction 10. Recommendation of preventive services. Immunizations up to date. She has decided against mammogram 11. Labs based on risk factors based metabolic panel, hepatic panel, and lipid panel 12. Care Plan as above    Review of Systems  Constitutional: Negative for fever, activity change, appetite change, fatigue and unexpected weight change.  HENT: Negative for ear pain, hearing loss, sore throat and trouble swallowing.   Eyes: Negative for visual disturbance.  Respiratory: Negative for cough and shortness of breath.   Cardiovascular: Negative for chest pain and palpitations.  Gastrointestinal: Negative for abdominal pain, diarrhea, constipation and blood in stool.  Endocrine: Negative for polydipsia and polyuria.  Genitourinary: Negative for dysuria and hematuria.  Musculoskeletal: Positive for arthralgias. Negative for back pain and myalgias.  Skin: Negative for rash.  Neurological: Negative for dizziness, syncope and headaches.  Hematological: Negative for adenopathy.  Psychiatric/Behavioral: Negative for confusion and dysphoric mood.       Objective:   Physical Exam  Constitutional: She is oriented to person, place, and time. She appears  well-developed and well-nourished.  HENT:  Right Ear: External ear normal.  Left Ear: External ear normal.  Mouth/Throat: Oropharynx is clear and moist.  Neck: Neck supple. No thyromegaly present.  Cardiovascular: Normal rate and regular rhythm.   Pulmonary/Chest: Effort normal and breath sounds normal. No respiratory  distress. She has no wheezes. She has no rales.  Abdominal: Soft. There is no tenderness.  Musculoskeletal: She exhibits no edema.  Neurological: She is alert and oriented to person, place, and time. No cranial nerve deficit.  Skin: No rash noted.  Psychiatric: She has a normal mood and affect. Her behavior is normal.          Assessment & Plan:  Complete physical. Flu vaccine already given. Immunizations up-to-date. She has decided on [name] other year mammogram. No indication for further pap smears.  Hypertension. Well controlled. Check basic metabolic panel. Refill lisinopril HCTZ for one year  Probable acute sinusitis.  Azithromycin for 5 days and plenty of fluids.

## 2013-08-10 NOTE — Progress Notes (Signed)
Pre visit review using our clinic review tool, if applicable. No additional management support is needed unless otherwise documented below in the visit note. 

## 2014-04-19 ENCOUNTER — Ambulatory Visit (INDEPENDENT_AMBULATORY_CARE_PROVIDER_SITE_OTHER): Payer: Medicare Other | Admitting: Family Medicine

## 2014-04-19 ENCOUNTER — Encounter: Payer: Self-pay | Admitting: Family Medicine

## 2014-04-19 VITALS — BP 128/80 | HR 81 | Temp 97.7°F | Wt 155.0 lb

## 2014-04-19 DIAGNOSIS — R102 Pelvic and perineal pain: Secondary | ICD-10-CM

## 2014-04-19 DIAGNOSIS — N949 Unspecified condition associated with female genital organs and menstrual cycle: Secondary | ICD-10-CM

## 2014-04-19 LAB — CBC WITH DIFFERENTIAL/PLATELET
Basophils Absolute: 0 10*3/uL (ref 0.0–0.1)
Basophils Relative: 0.5 % (ref 0.0–3.0)
Eosinophils Absolute: 0.1 10*3/uL (ref 0.0–0.7)
Eosinophils Relative: 1.8 % (ref 0.0–5.0)
HCT: 44.1 % (ref 36.0–46.0)
Hemoglobin: 14.8 g/dL (ref 12.0–15.0)
Lymphocytes Relative: 34.6 % (ref 12.0–46.0)
Lymphs Abs: 2 10*3/uL (ref 0.7–4.0)
MCHC: 33.7 g/dL (ref 30.0–36.0)
MCV: 95.6 fl (ref 78.0–100.0)
Monocytes Absolute: 0.7 10*3/uL (ref 0.1–1.0)
Monocytes Relative: 12.3 % — ABNORMAL HIGH (ref 3.0–12.0)
Neutro Abs: 3 10*3/uL (ref 1.4–7.7)
Neutrophils Relative %: 50.8 % (ref 43.0–77.0)
Platelets: 247 10*3/uL (ref 150.0–400.0)
RBC: 4.61 Mil/uL (ref 3.87–5.11)
RDW: 14.3 % (ref 11.5–15.5)
WBC: 5.8 10*3/uL (ref 4.0–10.5)

## 2014-04-19 NOTE — Progress Notes (Signed)
Pre visit review using our clinic review tool, if applicable. No additional management support is needed unless otherwise documented below in the visit note. 

## 2014-04-19 NOTE — Progress Notes (Signed)
   Subjective:    Patient ID: [name], female    DOB: Sep 25, 1940, 73 y.o.   MRN: [id number]  Flank Pain Pertinent negatives include no chest pain or fever.   Patient seen with right pelvic pain for about 3 weeks. No injury. Pain is somewhat intermittent and relatively mild in severity. She describes sharp and sometimes achy quality. No radiation. Location is right adnexal region. She has not had any distention or swelling. No clear triggers. Pain possibly worse in the morning when she first gets up. Denies any radiculopathy symptoms. Occasional lumbar back pain. Denies any fever, chills, skin rash, nausea or vomiting, stool changes. She had colonoscopy 2012 unremarkable. No dysuria  Past Medical History  Diagnosis Date  . Hypertension   . Osteoporosis   . Arthritis   . Cataract    Past Surgical History  Procedure Laterality Date  . Joint replacement  2009, 2010    knee replacements  . Nasal sinus surgery    . Vocal cord polyps      REMOVED  . Cataracts    . Colonoscopy  06/2002  . Bunionectomy      reports that she quit smoking about 26 years ago. She has never used smokeless tobacco. She reports that she does not drink alcohol or use illicit drugs. family history includes Arthritis in her father; Cancer in her father; Colon cancer (age of onset: 6577) in her sister. Allergies  Allergen Reactions  . Alendronate Sodium     REACTION: constipation  . Celecoxib     REACTION: GI upset  . Penicillins     REACTION: serious dehydration, hospitalized in 1990      Review of Systems  Constitutional: Negative for fever, chills, appetite change and unexpected weight change.  Respiratory: Negative for cough and shortness of breath.   Cardiovascular: Negative for chest pain.  Gastrointestinal: Negative for nausea, vomiting, diarrhea, constipation, blood in stool, abdominal distention and rectal pain.  Genitourinary: Positive for flank pain.  Hematological: Negative for  adenopathy.       Objective:   Physical Exam  Constitutional: She appears well-developed and well-nourished.  Cardiovascular: Normal rate.   Pulmonary/Chest: Effort normal and breath sounds normal. No respiratory distress. She has no wheezes. She has no rales.  Abdominal: Soft. Bowel sounds are normal. She exhibits no distension and no mass. There is no rebound and no guarding.  She has minimal tenderness over the right adnexal region. No guarding or rebound. No mass.  Genitourinary:  No inguinal adenopathy.  No inguinal hernia noted.  Musculoskeletal:  Full range of motion right hip. No lateral hip tenderness.          Assessment & Plan:  Three-week history of right adnexal/pelvic pain. Relatively nonfocal exam. No red flags such as appetite or weight changes. Set up pelvic ultrasound. Obtain labs with CBC, sedimentation rate, CA 125, urinalysis.  Doubt lumbar etiology.

## 2014-04-19 NOTE — Patient Instructions (Signed)
Pelvic Pain Female pelvic pain can be caused by many different things and start from a variety of places. Pelvic pain refers to pain that is located in the lower half of the abdomen and between your hips. The pain may occur over a short period of time (acute) or may be reoccurring (chronic). The cause of pelvic pain may be related to disorders affecting the female reproductive organs (gynecologic), but it may also be related to the bladder, kidney stones, an intestinal complication, or muscle or skeletal problems. Getting help right away for pelvic pain is important, especially if there has been severe, sharp, or a sudden onset of unusual pain. It is also important to get help right away because some types of pelvic pain can be life threatening.  CAUSES  Below are only some of the causes of pelvic pain. The causes of pelvic pain can be in one of several categories.   Gynecologic.  Pelvic inflammatory disease.  Sexually transmitted infection.  Ovarian cyst or a twisted ovarian ligament (ovarian torsion).  Uterine lining that grows outside the uterus (endometriosis).  Fibroids, cysts, or tumors.  Ovulation.  Pregnancy.  Pregnancy that occurs outside the uterus (ectopic pregnancy).  Miscarriage.  Labor.  Abruption of the placenta or ruptured uterus.  Infection.  Uterine infection (endometritis).  Bladder infection.  Diverticulitis.  Miscarriage related to a uterine infection (septic abortion).  Bladder.  Inflammation of the bladder (cystitis).  Kidney stone(s).  Gastrointestinal.  Constipation.  Diverticulitis.  Neurologic.  Trauma.  Feeling pelvic pain because of mental or emotional causes (psychosomatic).  Cancers of the bowel or pelvis. EVALUATION  Your caregiver will want to take a careful history of your concerns. This includes recent changes in your health, a careful gynecologic history of your periods (menses), and a sexual history. Obtaining your family  history and medical history is also important. Your caregiver may suggest a pelvic exam. A pelvic exam will help identify the location and severity of the pain. It also helps in the evaluation of which organ system may be involved. In order to identify the cause of the pelvic pain and be properly treated, your caregiver may order tests. These tests may include:   A pregnancy test.  Pelvic ultrasonography.  An X-ray exam of the abdomen.  A urinalysis or evaluation of vaginal discharge.  Blood tests. HOME CARE INSTRUCTIONS   Only take over-the-counter or prescription medicines for pain, discomfort, or fever as directed by your caregiver.   Rest as directed by your caregiver.   Eat a balanced diet.   Drink enough fluids to make your urine clear or pale yellow, or as directed.   Avoid sexual intercourse if it causes pain.   Apply warm or cold compresses to the lower abdomen depending on which one helps the pain.   Avoid stressful situations.   Keep a journal of your pelvic pain. Write down when it started, where the pain is located, and if there are things that seem to be associated with the pain, such as food or your menstrual cycle.  Follow up with your caregiver as directed.  SEEK MEDICAL CARE IF:  Your medicine does not help your pain.  You have abnormal vaginal discharge. SEEK IMMEDIATE MEDICAL CARE IF:   You have heavy bleeding from the vagina.   Your pelvic pain increases.   You feel light-headed or faint.   You have chills.   You have pain with urination or blood in your urine.   You have uncontrolled diarrhea   or vomiting.   You have a fever or persistent symptoms for more than 3 days.  You have a fever and your symptoms suddenly get worse.   You are being physically or sexually abused.  MAKE SURE YOU:  Understand these instructions.  Will watch your condition.  Will get help if you are not doing well or get worse. Document Released:  08/06/2004 Document Revised: 01/24/2014 Document Reviewed: 12/30/2011 Exit[employer] Patient Information 2015 [employer]. This information is not intended to replace advice given to you by your health care provider. Make sure you discuss any questions you have with your health care provider.  

## 2014-04-20 LAB — CA 125: CA 125: 8 U/mL (ref 0.0–30.2)

## 2014-04-21 ENCOUNTER — Other Ambulatory Visit: Payer: Medicare Other

## 2014-04-22 ENCOUNTER — Telehealth: Payer: Self-pay | Admitting: Family Medicine

## 2014-04-22 ENCOUNTER — Ambulatory Visit
Admission: RE | Admit: 2014-04-22 | Discharge: 2014-04-22 | Disposition: A | Discharge status: Home / Self Care | Payer: Medicare Other | Source: Ambulatory Visit | Attending: Family Medicine | Admitting: Family Medicine

## 2014-04-22 DIAGNOSIS — R102 Pelvic and perineal pain: Secondary | ICD-10-CM

## 2014-04-22 NOTE — Telephone Encounter (Signed)
Pt aware see result note 

## 2014-04-22 NOTE — Telephone Encounter (Signed)
Pt would like a call back about her results today ..[name]

## 2014-04-25 ENCOUNTER — Telehealth: Payer: Self-pay | Admitting: Family Medicine

## 2014-04-25 NOTE — Telephone Encounter (Signed)
Pt informed that Dr. Caryl NeverBurchette is out of the office this week. Pt states the pain is okay its just a dull pain. Pt is aware that her results came back normal. Pt will give a call back next week.

## 2014-04-25 NOTE — Telephone Encounter (Signed)
Pt went to imaging center, got message no abnormalities. Pt still has symptoms and would like to know where to proceed from here!! Pt states she is still not well.  pls advise.

## 2014-04-27 ENCOUNTER — Emergency Department (HOSPITAL_COMMUNITY): Payer: Medicare Other

## 2014-04-27 ENCOUNTER — Encounter (HOSPITAL_COMMUNITY): Payer: Self-pay | Admitting: Emergency Medicine

## 2014-04-27 ENCOUNTER — Emergency Department (HOSPITAL_COMMUNITY)
Admission: EM | Admit: 2014-04-27 | Discharge: 2014-04-27 | Disposition: A | Discharge status: Home / Self Care | Payer: Medicare Other | Attending: Emergency Medicine | Admitting: Emergency Medicine

## 2014-04-27 DIAGNOSIS — Z79899 Other long term (current) drug therapy: Secondary | ICD-10-CM | POA: Insufficient documentation

## 2014-04-27 DIAGNOSIS — R109 Unspecified abdominal pain: Secondary | ICD-10-CM | POA: Insufficient documentation

## 2014-04-27 DIAGNOSIS — Z8739 Personal history of other diseases of the musculoskeletal system and connective tissue: Secondary | ICD-10-CM | POA: Diagnosis not present

## 2014-04-27 DIAGNOSIS — R1031 Right lower quadrant pain: Secondary | ICD-10-CM | POA: Insufficient documentation

## 2014-04-27 DIAGNOSIS — IMO0002 Reserved for concepts with insufficient information to code with codable children: Secondary | ICD-10-CM | POA: Insufficient documentation

## 2014-04-27 DIAGNOSIS — R5381 Other malaise: Secondary | ICD-10-CM | POA: Insufficient documentation

## 2014-04-27 DIAGNOSIS — M5416 Radiculopathy, lumbar region: Secondary | ICD-10-CM

## 2014-04-27 DIAGNOSIS — Z8669 Personal history of other diseases of the nervous system and sense organs: Secondary | ICD-10-CM | POA: Insufficient documentation

## 2014-04-27 DIAGNOSIS — R5383 Other fatigue: Secondary | ICD-10-CM | POA: Diagnosis not present

## 2014-04-27 DIAGNOSIS — I10 Essential (primary) hypertension: Secondary | ICD-10-CM | POA: Diagnosis not present

## 2014-04-27 DIAGNOSIS — Z880 Allergy status to penicillin: Secondary | ICD-10-CM | POA: Diagnosis not present

## 2014-04-27 DIAGNOSIS — Z87891 Personal history of nicotine dependence: Secondary | ICD-10-CM | POA: Insufficient documentation

## 2014-04-27 LAB — COMPREHENSIVE METABOLIC PANEL
ALBUMIN: 4.4 g/dL (ref 3.5–5.2)
ALT: 23 U/L (ref 0–35)
ANION GAP: 13 (ref 5–15)
AST: 20 U/L (ref 0–37)
Alkaline Phosphatase: 77 U/L (ref 39–117)
BILIRUBIN TOTAL: 0.5 mg/dL (ref 0.3–1.2)
BUN: 13 mg/dL (ref 6–23)
CALCIUM: 10.1 mg/dL (ref 8.4–10.5)
CO2: 25 mEq/L (ref 19–32)
CREATININE: 0.58 mg/dL (ref 0.50–1.10)
Chloride: 100 mEq/L (ref 96–112)
GFR calc Af Amer: >90 mL/min (ref >90)
GFR, EST NON AFRICAN AMERICAN: 89 mL/min — AB (ref >90)
Glucose, Bld: 106 mg/dL — ABNORMAL HIGH (ref 70–99)
Potassium: 3.6 mEq/L — ABNORMAL LOW (ref 3.7–5.3)
Sodium: 138 mEq/L (ref 137–147)
Total Protein: 7.4 g/dL (ref 6.0–8.3)

## 2014-04-27 LAB — URINALYSIS, ROUTINE W REFLEX MICROSCOPIC
BILIRUBIN URINE: NEGATIVE
GLUCOSE, UA: NEGATIVE mg/dL
HGB URINE DIPSTICK: NEGATIVE
KETONES UR: NEGATIVE mg/dL
Nitrite: NEGATIVE
PROTEIN: NEGATIVE mg/dL
Specific Gravity, Urine: 1.012 (ref 1.005–1.030)
Urobilinogen, UA: 0.2 mg/dL (ref 0.0–1.0)
pH: 7.5 (ref 5.0–8.0)

## 2014-04-27 LAB — CBC WITH DIFFERENTIAL/PLATELET
BASOS ABS: 0 10*3/uL (ref 0.0–0.1)
BASOS PCT: 1 % (ref 0–1)
EOS PCT: 2 % (ref 0–5)
Eosinophils Absolute: 0.1 10*3/uL (ref 0.0–0.7)
HEMATOCRIT: 45.7 % (ref 36.0–46.0)
HEMOGLOBIN: 15.6 g/dL — AB (ref 12.0–15.0)
Lymphocytes Relative: 33 % (ref 12–46)
Lymphs Abs: 2 10*3/uL (ref 0.7–4.0)
MCH: 32 pg (ref 26.0–34.0)
MCHC: 34.1 g/dL (ref 30.0–36.0)
MCV: 93.6 fL (ref 78.0–100.0)
MONO ABS: 0.5 10*3/uL (ref 0.1–1.0)
MONOS PCT: 9 % (ref 3–12)
Neutro Abs: 3.4 10*3/uL (ref 1.7–7.7)
Neutrophils Relative %: 55 % (ref 43–77)
Platelets: 248 10*3/uL (ref 150–400)
RBC: 4.88 MIL/uL (ref 3.87–5.11)
RDW: 13.1 % (ref 11.5–15.5)
WBC: 6.1 10*3/uL (ref 4.0–10.5)

## 2014-04-27 LAB — URINE MICROSCOPIC-ADD ON

## 2014-04-27 LAB — I-STAT CG4 LACTIC ACID, ED: Lactic Acid, Venous: 0.7 mmol/L (ref 0.5–2.2)

## 2014-04-27 MED ORDER — IOHEXOL 300 MG/ML  SOLN
50.0000 mL | Freq: Once | INTRAMUSCULAR | Status: AC | PRN
  Administered 2014-04-27: 50 mL via ORAL

## 2014-04-27 MED ORDER — TRAMADOL HCL 50 MG PO TABS: 50.0000 mg | ORAL_TABLET | Freq: Four times a day (QID) | ORAL | Status: DC | PRN

## 2014-04-27 MED ORDER — IOHEXOL 300 MG/ML  SOLN
100.0000 mL | Freq: Once | INTRAMUSCULAR | Status: AC | PRN
  Administered 2014-04-27: 100 mL via INTRAVENOUS

## 2014-04-27 MED ORDER — PREDNISONE 20 MG PO TABS: 40.0000 mg | ORAL_TABLET | Freq: Every day | ORAL | Status: DC

## 2014-04-27 NOTE — ED Provider Notes (Signed)
  Face-to-face evaluation   History: Elderly, female, with right-sided abdominal pain, and right upper lumbar discomfort, for several days. No known trauma. She has previous previously been evaluated for this by her PCP.  Physical exam: Alert, elderly, female. Abdomen soft, and nontender to palpation. Mild right lumbar and right lateral abdominal wall tenderness. Negative straight leg raising bilaterally.  Medical screening examination/treatment/procedure(s) were conducted as a shared visit with non-physician practitioner(s) and myself.  I personally evaluated the patient during the encounter   Flint MelterElliott L Laiya Wisby, MD 04/27/14 [phone]

## 2014-04-27 NOTE — ED Notes (Signed)
Per pt, states abdominal pain since July-saw PCP and he thought it might be GYN issue-had ultrasound

## 2014-04-27 NOTE — ED Provider Notes (Signed)
CSN: [id number]     Arrival date & time 04/27/14  0802 History   First MD Initiated Contact with Patient 04/27/14 [phone]     Chief Complaint  Patient presents with  . Abdominal Pain     (Consider location/radiation/quality/duration/timing/severity/associated sxs/prior Treatment) HPI [name] is a 73 y.o. female who presents to emergency department complaining of lower abdominal pain. Patient states she has had lower abdominal discomfort for few weeks, states went to see her primary care doctor who ordered ultrasound of her pelvis last week. Patient states she had an ultrasound done and it was normal. She states since then the pain worsened. States her primary care doctor is out of town. She states she did not sleep all that last night due to pain. Pain is primarily in her right lower quadrant, worsened with movement. She denies pain radiation. She denies any nausea or vomiting. She denies any urinary symptoms or changes in her bowels. Patient states that she did not take anything for pain. Denies history of the same. No fever or chills. She does state that she feels very weak and has no energy. Nothing is making pain better or worse.  Past Medical History  Diagnosis Date  . Hypertension   . Osteoporosis   . Arthritis   . Cataract    Past Surgical History  Procedure Laterality Date  . Joint replacement  2009, 2010    knee replacements  . Nasal sinus surgery    . Vocal cord polyps      REMOVED  . Cataracts    . Colonoscopy  06/2002  . Bunionectomy     Family History  Problem Relation Age of Onset  . Cancer Father     prostate  . Arthritis Father   . Colon cancer Sister 73   History  Substance Use Topics  . Smoking status: Former Smoker -- 0.50 packs/day for 3 years    Quit date: 11/08/1987  . Smokeless tobacco: Never Used  . Alcohol Use: No   OB History   Grav Para Term Preterm Abortions TAB SAB Ect Mult Living                 Review of Systems  Constitutional:  Positive for activity change and fatigue. Negative for fever and chills.  Respiratory: Negative for cough, chest tightness and shortness of breath.   Cardiovascular: Negative for chest pain, palpitations and leg swelling.  Gastrointestinal: Positive for abdominal pain. Negative for nausea, vomiting, diarrhea, blood in stool, abdominal distention and anal bleeding.  Genitourinary: Negative for dysuria, hematuria, flank pain, vaginal bleeding, vaginal discharge, vaginal pain and pelvic pain.  Musculoskeletal: Negative for arthralgias, myalgias, neck pain and neck stiffness.  Skin: Negative for rash.  Neurological: Positive for weakness. Negative for dizziness and headaches.  All other systems reviewed and are negative.     Allergies  Alendronate sodium; Celecoxib; and Penicillins  Home Medications   Prior to Admission medications   Medication Sig Start Date End Date Taking? Authorizing Provider  lisinopril-hydrochlorothiazide (PRINZIDE,ZESTORETIC) 20-12.5 MG per tablet Take 1 tablet by mouth daily. 08/10/13   Kristian Covey, MD   BP 192/85  Pulse 72  Temp(Src) 97.8 F (36.6 C) (Oral)  Resp 16  SpO2 95% Physical Exam  Nursing note and vitals reviewed. Constitutional: She appears well-developed and well-nourished. No distress.  HENT:  Head: Normocephalic.  Eyes: Conjunctivae are normal.  Neck: Neck supple.  Cardiovascular: Normal rate, regular rhythm and intact distal pulses.   Pulmonary/Chest: Effort  normal and breath sounds normal. No respiratory distress. She has no wheezes. She has no rales.  Abdominal: Soft. Bowel sounds are normal. She exhibits no distension. There is tenderness. There is no rebound.  Right lower quadrant/right inguinal tenderness. No hernias palpated. Negative psoas and obturator sign.  Musculoskeletal: She exhibits no edema.  Normal right hip, full range of motion, no pain with flexion, internal and external rotation. DP pulses intact and equal  bilaterally  Neurological: She is alert.  Skin: Skin is warm and dry.  Psychiatric: She has a normal mood and affect. Her behavior is normal.    ED Course  Procedures (including critical care time) Labs Review Labs Reviewed  URINALYSIS, ROUTINE W REFLEX MICROSCOPIC - Abnormal; Notable for the following:    Leukocytes, UA TRACE (*)    All other components within normal limits  CBC WITH DIFFERENTIAL - Abnormal; Notable for the following:    Hemoglobin 15.6 (*)    All other components within normal limits  COMPREHENSIVE METABOLIC PANEL - Abnormal; Notable for the following:    Potassium 3.6 (*)    Glucose, Bld 106 (*)    GFR calc non Af Amer 89 (*)    All other components within normal limits  URINE MICROSCOPIC-ADD ON  I-STAT CG4 LACTIC ACID, ED    Imaging Review Ct Abdomen Pelvis W Contrast  04/27/2014   CLINICAL DATA:  Right lower quadrant pain  EXAM: CT ABDOMEN AND PELVIS WITH CONTRAST  TECHNIQUE: Multidetector CT imaging of the abdomen and pelvis was performed using the standard protocol following bolus administration of intravenous contrast.  CONTRAST:  100mL OMNIPAQUE IOHEXOL 300 MG/ML  SOLN  COMPARISON:  None.  FINDINGS: Bibasilar atelectasis.  Liver, gallbladder, spleen, pancreas, adrenal glands are within normal limits. Tiny hypodensities in the kidneys are likely small cysts.  Normal appendix.  Bladder, uterus, and adnexa are within normal limits.  Sigmoid diverticulosis without acute diverticulitis  No free-fluid.  No abnormal retroperitoneal adenopathy.  Small hiatal hernia.  No vertebral compression.  Scoliosis.  Left inguinal hernia contains only adipose tissue.  IMPRESSION: No acute intra-abdominal pathology.  Normal appendix.   Electronically Signed   By: Maryclare BeanArt  Hoss M.D.   On: 04/27/2014 10:33     EKG Interpretation None      MDM   Final diagnoses:  Right lower quadrant abdominal pain  Lumbar radiculopathy    Pt with lower abdominal pain for few weeks, worsened in  the last 5 days. Afebrile. No associated symptoms other than weakness. Suspicious for hernia vs colitis. Will get labs, UA. Abdomen is soft at this time.   Pt had US pelvis performed 1 week ago, normal.   Offered pain medications, pt refused.    Pt's CT abd/pelvis obtained and is negative. Dr. Effie ShyWentz discussed CT findings with radiology. Possible radicular pain from spinal stenosis. Will start on prednisone, ultram for pain. Close follow up with PCP. Pt agreeable to the plan. Non toxic at this time. BP improved. Home with close follow up.   Filed Vitals:   04/27/14 0807 04/27/14 1045 04/27/14 1204  BP: 192/85 144/70 160/95  Pulse: 72 75 66  Temp: 97.8 F (36.6 C) 97.4 F (36.3 C) 97.5 F (36.4 C)  TempSrc: Oral Oral Oral  Resp: 16 16 16   SpO2: 95% 97% 94%     Lottie Musselatyana A Kiel Cockerell, PA-C 04/27/14 1607

## 2014-04-27 NOTE — Discharge Instructions (Signed)
Ultram for pain. Prednisone for severe pain. Your CT scan is normal here, there are some degenerative changes in your back which could be the cause of your pain. Please follow up with primary care doctor and orthopedics specialist. Return if symptoms are worsening.   Radicular Pain Radicular pain in either the arm or leg is usually from a bulging or herniated disk in the spine. A piece of the herniated disk may press against the nerves as the nerves exit the spine. This causes pain which is felt at the tips of the nerves down the arm or leg. Other causes of radicular pain may include:  Fractures.  Heart disease.  Cancer.  An abnormal and usually degenerative state of the nervous system or nerves (neuropathy). Diagnosis may require CT or MRI scanning to determine the primary cause.  Nerves that start at the neck (nerve roots) may cause radicular pain in the outer shoulder and arm. It can spread down to the thumb and fingers. The symptoms vary depending on which nerve root has been affected. In most cases radicular pain improves with conservative treatment. Neck problems may require physical therapy, a neck collar, or cervical traction. Treatment may take many weeks, and surgery may be considered if the symptoms do not improve.  Conservative treatment is also recommended for sciatica. Sciatica causes pain to radiate from the lower back or buttock area down the leg into the foot. Often there is a history of back problems. Most patients with sciatica are better after 2 to 4 weeks of rest and other supportive care. Short term bed rest can reduce the disk pressure considerably. Sitting, however, is not a good position since this increases the pressure on the disk. You should avoid bending, lifting, and all other activities which make the problem worse. Traction can be used in severe cases. Surgery is usually reserved for patients who do not improve within the first months of treatment. Only take  over-the-counter or prescription medicines for pain, discomfort, or fever as directed by your caregiver. Narcotics and muscle relaxants may help by relieving more severe pain and spasm and by providing mild sedation. Cold or massage can give significant relief. Spinal manipulation is not recommended. It can increase the degree of disc protrusion. Epidural steroid injections are often effective treatment for radicular pain. These injections deliver medicine to the spinal nerve in the space between the protective covering of the spinal cord and back bones (vertebrae). Your caregiver can give you more information about steroid injections. These injections are most effective when given within two weeks of the onset of pain.  You should see your caregiver for follow up care as recommended. A program for neck and back injury rehabilitation with stretching and strengthening exercises is an important part of management.  SEEK IMMEDIATE MEDICAL CARE IF:  You develop increased pain, weakness, or numbness in your arm or leg.  You develop difficulty with bladder or bowel control.  You develop abdominal pain. Document Released: 10/17/2004 Document Revised: 12/02/2011 Document Reviewed: 01/02/2009 [facility]ExitCare Patient Information 2015 [place], [place]. This information is not intended to replace advice given to you by your health care provider. Make sure you discuss any questions you have with your health care provider.

## 2014-05-12 ENCOUNTER — Ambulatory Visit (INDEPENDENT_AMBULATORY_CARE_PROVIDER_SITE_OTHER): Payer: Medicare Other | Admitting: Family Medicine

## 2014-05-12 ENCOUNTER — Encounter: Payer: Self-pay | Admitting: Family Medicine

## 2014-05-12 ENCOUNTER — Ambulatory Visit (INDEPENDENT_AMBULATORY_CARE_PROVIDER_SITE_OTHER)
Admission: RE | Admit: 2014-05-12 | Discharge: 2014-05-12 | Disposition: A | Discharge status: Home / Self Care | Payer: Medicare Other | Source: Ambulatory Visit | Attending: Family Medicine | Admitting: Family Medicine

## 2014-05-12 VITALS — BP 128/76 | HR 84 | Temp 97.6°F | Wt 157.0 lb

## 2014-05-12 DIAGNOSIS — R1031 Right lower quadrant pain: Secondary | ICD-10-CM

## 2014-05-12 DIAGNOSIS — M25559 Pain in unspecified hip: Secondary | ICD-10-CM

## 2014-05-12 DIAGNOSIS — M25551 Pain in right hip: Secondary | ICD-10-CM

## 2014-05-12 MED ORDER — GABAPENTIN 100 MG PO CAPS: 100.0000 mg | ORAL_CAPSULE | Freq: Three times a day (TID) | ORAL | Status: DC

## 2014-05-12 NOTE — Progress Notes (Signed)
   Subjective:    Patient ID: [name], female    DOB: 06/17/1941, 73 y.o.   MRN: [id number]  Abdominal Pain Pertinent negatives include no constipation, diarrhea, fever, nausea or vomiting.   Patient seen for followup regarding right lower abdominal/pelvic pain with duration for about a month now. We had sent her for pelvic ultrasound which was unremarkable. Lab work was unremarkable.  She subsequently went to the emergency department on 04/27/2014 and had CT abdomen and pelvis which likewise was unremarkable. She had comprehensive metabolic panel and CBC which were unremarkable. She was prescribed prednisone and tramadol that did help somewhat. She went to orthopedist who apparently obtained lumbar films which did not show any abnormality.  She continues to take tramadol as needed. She describes sharp occasional burning pain right lower abdomen. No nausea or vomiting. No change in bowel habits. Colonoscopy 2012. No exacerbating factors. Improved with Tylenol and also Ultram. No recent appetite or weight changes  Past Medical History  Diagnosis Date  . Hypertension   . Osteoporosis   . Arthritis   . Cataract    Past Surgical History  Procedure Laterality Date  . Joint replacement  2009, 2010    knee replacements  . Nasal sinus surgery    . Vocal cord polyps      REMOVED  . Cataracts    . Colonoscopy  06/2002  . Bunionectomy      reports that she quit smoking about 26 years ago. She has never used smokeless tobacco. She reports that she does not drink alcohol or use illicit drugs. family history includes Arthritis in her father; Cancer in her father; Colon cancer (age of onset: 5477) in her sister. Allergies  Allergen Reactions  . Alendronate Sodium     REACTION: constipation  . Celecoxib     REACTION: GI upset  . Penicillins     REACTION: serious dehydration, hospitalized in 1990      Review of Systems  Constitutional: Negative for fever, chills, appetite change and  unexpected weight change.  Respiratory: Negative for cough and shortness of breath.   Cardiovascular: Negative for chest pain.  Gastrointestinal: Positive for abdominal pain. Negative for nausea, vomiting, diarrhea, constipation and abdominal distention.  Skin: Negative for rash.  Hematological: Negative for adenopathy. Does not bruise/bleed easily.       Objective:   Physical Exam  Constitutional: She appears well-developed and well-nourished.  Cardiovascular: Normal rate and regular rhythm.   Pulmonary/Chest: Effort normal and breath sounds normal. No respiratory distress. She has no wheezes. She has no rales.  Abdominal: Soft. Bowel sounds are normal. She exhibits no distension and no mass. There is no tenderness. There is no rebound and no guarding.  Musculoskeletal:  Right hip reveals excellent range of motion. No right lower extremity edema. Good distal pulses. No reproducible tenderness lumbar area  Skin: No rash noted.          Assessment & Plan:  Persistent right lower abdominal/pelvic pain. Not reproducible on exam today. Her symptoms are intermittent. She's had fairly extensive workup thus far with CT abdomen and pelvis, ultrasound, multiple labs as above unrevealing. Obtain pelvic films. If normal, consider trial of gabapentin 100 mg 3 times a day. Continue tramadol as needed.

## 2014-05-12 NOTE — Progress Notes (Signed)
Pre visit review using our clinic review tool, if applicable. No additional management support is needed unless otherwise documented below in the visit note. 

## 2014-05-26 ENCOUNTER — Ambulatory Visit (INDEPENDENT_AMBULATORY_CARE_PROVIDER_SITE_OTHER): Payer: Medicare Other | Admitting: Family Medicine

## 2014-05-26 ENCOUNTER — Encounter: Payer: Self-pay | Admitting: Family Medicine

## 2014-05-26 VITALS — BP 126/74 | HR 100 | Temp 97.8°F | Wt 152.0 lb

## 2014-05-26 DIAGNOSIS — R1031 Right lower quadrant pain: Secondary | ICD-10-CM

## 2014-05-26 DIAGNOSIS — Z23 Encounter for immunization: Secondary | ICD-10-CM

## 2014-05-26 NOTE — Progress Notes (Signed)
Pre visit review using our clinic review tool, if applicable. No additional management support is needed unless otherwise documented below in the visit note. 

## 2014-05-26 NOTE — Patient Instructions (Signed)
Please let me know if pain returns.

## 2014-05-26 NOTE — Progress Notes (Signed)
   Subjective:    Patient ID: [name], female    DOB: 11/17/1940, 73 y.o.   MRN: [id number]  HPI For followup regarding right lower abdominal and pelvic pain. Refer to multiple prior notes. She had extensive workup including abdominal ultrasound, CT abdomen and pelvis and multiple labs were drawn which were unrevealing. Plain films of pelvis revealed degenerative changes and no acute findings. We suspected possible neuropathic pain. She never had any rash. We started gabapentin low dosage currently 100 mg twice daily. Her pain is greatly improved.  pain at it's worst 3/10 pain. No change in bowel habits.  Past Medical History  Diagnosis Date  . Hypertension   . Osteoporosis   . Arthritis   . Cataract    Past Surgical History  Procedure Laterality Date  . Joint replacement  2009, 2010    knee replacements  . Nasal sinus surgery    . Vocal cord polyps      REMOVED  . Cataracts    . Colonoscopy  06/2002  . Bunionectomy      reports that she quit smoking about 26 years ago. She has never used smokeless tobacco. She reports that she does not drink alcohol or use illicit drugs. family history includes Arthritis in her father; Cancer in her father; Colon cancer (age of onset: 43) in her sister. Allergies  Allergen Reactions  . Alendronate Sodium     REACTION: constipation  . Celecoxib     REACTION: GI upset  . Penicillins     REACTION: serious dehydration, hospitalized in 1990  '    Review of Systems  Constitutional: Negative for appetite change and unexpected weight change.  Respiratory: Negative for cough.   Cardiovascular: Negative for chest pain.       Objective:   Physical Exam  Constitutional: She appears well-developed and well-nourished.  Cardiovascular: Normal rate and regular rhythm.   Pulmonary/Chest: Effort normal and breath sounds normal. No respiratory distress. She has no wheezes. She has no rales.  Abdominal: Soft. Bowel sounds are normal. She  exhibits no distension. There is no tenderness. There is no rebound and no guarding.  Skin: No rash noted.          Assessment & Plan:  Recent right lower quadrant abdominal/pelvic pain. Greatly improved. Suspect neuropathic pain. Continue gabapentin and tapering regimen given if pain remains well-controlled. Flu vaccine given

## 2014-08-10 ENCOUNTER — Encounter: Payer: Self-pay | Admitting: Family Medicine

## 2014-08-10 ENCOUNTER — Ambulatory Visit (INDEPENDENT_AMBULATORY_CARE_PROVIDER_SITE_OTHER): Payer: Medicare Other | Admitting: Family Medicine

## 2014-08-10 VITALS — BP 130/80 | HR 86 | Temp 97.5°F | Wt 155.0 lb

## 2014-08-10 DIAGNOSIS — J0100 Acute maxillary sinusitis, unspecified: Secondary | ICD-10-CM

## 2014-08-10 MED ORDER — AZITHROMYCIN 250 MG PO TABS: ORAL_TABLET | ORAL | Status: AC

## 2014-08-10 NOTE — Patient Instructions (Signed)

## 2014-08-10 NOTE — Progress Notes (Signed)
Pre visit review using our clinic review tool, if applicable. No additional management support is needed unless otherwise documented below in the visit note. 

## 2014-08-10 NOTE — Progress Notes (Signed)
   Subjective:    Patient ID: [name], female    DOB: 1941-06-29, 73 y.o.   MRN: [id number]  HPI  Patient seen for 2 week history of some facial pain maxillary region and nasal congestion and cough. She's had some increased malaise. No fever. She's tried over-the-counter Sudafed without much improvement. She describes bilateral maxillary facial pain and some bloody discharge off and on. Also has some upper teeth pain. She's had similar symptoms with sinusitis in the past.  Past Medical History  Diagnosis Date  . Hypertension   . Osteoporosis   . Arthritis   . Cataract    Past Surgical History  Procedure Laterality Date  . Joint replacement  2009, 2010    knee replacements  . Nasal sinus surgery    . Vocal cord polyps      REMOVED  . Cataracts    . Colonoscopy  06/2002  . Bunionectomy      reports that she quit smoking about 26 years ago. She has never used smokeless tobacco. She reports that she does not drink alcohol or use illicit drugs. family history includes Arthritis in her father; Cancer in her father; Colon cancer (age of onset: 4477) in her sister. Allergies  Allergen Reactions  . Alendronate Sodium     REACTION: constipation  . Celecoxib     REACTION: GI upset  . Penicillins     REACTION: serious dehydration, hospitalized in 1990     Review of Systems  Constitutional: Positive for fatigue. Negative for fever and chills.  HENT: Positive for congestion and sinus pressure.   Respiratory: Positive for cough.   Gastrointestinal: Negative for nausea and vomiting.  Neurological: Positive for headaches.       Objective:   Physical Exam  Constitutional: She appears well-developed and well-nourished.  HENT:  Right Ear: External ear normal.  Left Ear: External ear normal.  Mouth/Throat: Oropharynx is clear and moist.  Neck: Neck supple.  Cardiovascular: Normal rate and regular rhythm.   Pulmonary/Chest: Effort normal and breath sounds normal. No  respiratory distress. She has no wheezes. She has no rales.  Lymphadenopathy:    She has no cervical adenopathy.          Assessment & Plan:  Acute bilateral maxillary sinusitis. Zithromax for 5 days. Continue Sudafed as needed. Consider over-the-counter Flonase for allergy symptoms

## 2014-08-22 ENCOUNTER — Other Ambulatory Visit: Payer: Self-pay | Admitting: Family Medicine

## 2015-02-28 ENCOUNTER — Telehealth: Payer: Self-pay

## 2015-02-28 NOTE — Telephone Encounter (Signed)
Spoke with pt. [place] herself.  I asked that she have results sent to our office.

## 2015-03-20 ENCOUNTER — Other Ambulatory Visit: Payer: Self-pay

## 2015-05-30 ENCOUNTER — Other Ambulatory Visit: Payer: Self-pay | Admitting: Family Medicine

## 2015-07-11 ENCOUNTER — Ambulatory Visit (INDEPENDENT_AMBULATORY_CARE_PROVIDER_SITE_OTHER): Payer: Medicare Other | Admitting: Family Medicine

## 2015-07-11 ENCOUNTER — Encounter: Payer: Self-pay | Admitting: Family Medicine

## 2015-07-11 VITALS — BP 122/80 | HR 75 | Temp 97.6°F | Ht <= 58 in | Wt 153.7 lb

## 2015-07-11 DIAGNOSIS — Z0000 Encounter for general adult medical examination without abnormal findings: Secondary | ICD-10-CM | POA: Diagnosis not present

## 2015-07-11 DIAGNOSIS — Z23 Encounter for immunization: Secondary | ICD-10-CM | POA: Diagnosis not present

## 2015-07-11 DIAGNOSIS — I10 Essential (primary) hypertension: Secondary | ICD-10-CM | POA: Diagnosis not present

## 2015-07-11 LAB — CBC WITH DIFFERENTIAL/PLATELET
BASOS ABS: 0 10*3/uL (ref 0.0–0.1)
Basophils Relative: 0.7 % (ref 0.0–3.0)
Eosinophils Absolute: 0.1 10*3/uL (ref 0.0–0.7)
Eosinophils Relative: 2.1 % (ref 0.0–5.0)
HCT: 45.8 % (ref 36.0–46.0)
Hemoglobin: 15.2 g/dL — ABNORMAL HIGH (ref 12.0–15.0)
LYMPHS ABS: 1.9 10*3/uL (ref 0.7–4.0)
Lymphocytes Relative: 35.9 % (ref 12.0–46.0)
MCHC: 33.2 g/dL (ref 30.0–36.0)
MCV: 96.5 fl (ref 78.0–100.0)
Monocytes Absolute: 0.5 10*3/uL (ref 0.1–1.0)
Monocytes Relative: 9.3 % (ref 3.0–12.0)
NEUTROS ABS: 2.8 10*3/uL (ref 1.4–7.7)
Neutrophils Relative %: 52 % (ref 43.0–77.0)
PLATELETS: 227 10*3/uL (ref 150.0–400.0)
RBC: 4.75 Mil/uL (ref 3.87–5.11)
RDW: 14 % (ref 11.5–15.5)
WBC: 5.3 10*3/uL (ref 4.0–10.5)

## 2015-07-11 LAB — BASIC METABOLIC PANEL
BUN: 12 mg/dL (ref 6–23)
CHLORIDE: 102 meq/L (ref 96–112)
CO2: 32 meq/L (ref 19–32)
Calcium: 10.4 mg/dL (ref 8.4–10.5)
Creatinine, Ser: 0.63 mg/dL (ref 0.40–1.20)
GFR: 97.93 mL/min (ref >60.00)
Glucose, Bld: 99 mg/dL (ref 70–99)
POTASSIUM: 5 meq/L (ref 3.5–5.1)
SODIUM: 141 meq/L (ref 135–145)

## 2015-07-11 LAB — LIPID PANEL
CHOL/HDL RATIO: 4
Cholesterol: 223 mg/dL — ABNORMAL HIGH (ref 0–200)
HDL: 51.3 mg/dL (ref >39.00)
LDL CALC: 149 mg/dL — AB (ref 0–99)
NonHDL: 171.87
TRIGLYCERIDES: 113 mg/dL (ref 0.0–149.0)
VLDL: 22.6 mg/dL (ref 0.0–40.0)

## 2015-07-11 LAB — HEPATIC FUNCTION PANEL
ALT: 17 U/L (ref 0–35)
AST: 18 U/L (ref 0–37)
Albumin: 4.4 g/dL (ref 3.5–5.2)
Alkaline Phosphatase: 70 U/L (ref 39–117)
BILIRUBIN TOTAL: 0.6 mg/dL (ref 0.2–1.2)
Bilirubin, Direct: 0.1 mg/dL (ref 0.0–0.3)
TOTAL PROTEIN: 6.8 g/dL (ref 6.0–8.3)

## 2015-07-11 LAB — TSH: TSH: 1.9 u[IU]/mL (ref 0.35–4.50)

## 2015-07-11 MED ORDER — LISINOPRIL-HYDROCHLOROTHIAZIDE 20-12.5 MG PO TABS: 1.0000 | ORAL_TABLET | Freq: Every day | ORAL | Status: DC

## 2015-07-11 NOTE — Patient Instructions (Signed)
Set up mammogram sometime this year.

## 2015-07-11 NOTE — Progress Notes (Signed)
Pre visit review using our clinic review tool, if applicable. No additional management support is needed unless otherwise documented below in the visit note. 

## 2015-07-11 NOTE — Addendum Note (Signed)
Addended by: Griselda MinerJIMENEZ, Heather Mckendree E on: 07/11/2015 10:11 AM   Modules accepted: Orders

## 2015-07-11 NOTE — Progress Notes (Signed)
Subjective:    Patient ID: [name], female    DOB: 1940/11/11, 74 y.o.   MRN: [id number]  HPI Patient here for complete physical. She has hypertension which is been fairly well controlled. She has history of obesity with no recent significant weight changes. She has osteoarthritis and has had bilateral knee replacements previously. No recent falls. She's had mild hyperlipidemia. No history of CAD or peripheral vascular disease. She's not had mammogram in over a year. Colonoscopy up-to-date. No history of Prevnar 13. Needs flu vaccine.  Past Medical History  Diagnosis Date  . Hypertension   . Osteoporosis   . Arthritis   . Cataract    Past Surgical History  Procedure Laterality Date  . Joint replacement  2009, 2010    knee replacements  . Nasal sinus surgery    . Vocal cord polyps      REMOVED  . Cataracts    . Colonoscopy  06/2002  . Bunionectomy      reports that she quit smoking about 27 years ago. She has never used smokeless tobacco. She reports that she does not drink alcohol or use illicit drugs. family history includes Arthritis in her father; Cancer in her father; Colon cancer (age of onset: 22) in her sister. Allergies  Allergen Reactions  . Alendronate Sodium     REACTION: constipation  . Celecoxib     REACTION: GI upset  . Penicillins     REACTION: serious dehydration, hospitalized in 1990      Review of Systems  Constitutional: Negative for fever, activity change, appetite change, fatigue and unexpected weight change.  HENT: Negative for ear pain, hearing loss, sore throat and trouble swallowing.   Eyes: Negative for visual disturbance.  Respiratory: Negative for cough and shortness of breath.   Cardiovascular: Negative for chest pain and palpitations.  Gastrointestinal: Negative for abdominal pain, diarrhea, constipation and blood in stool.  Endocrine: Negative for polydipsia and polyuria.  Genitourinary: Negative for dysuria and hematuria.    Musculoskeletal: Positive for back pain and arthralgias. Negative for myalgias.  Skin: Negative for rash.  Neurological: Negative for dizziness, syncope and headaches.  Hematological: Negative for adenopathy.  Psychiatric/Behavioral: Negative for confusion and dysphoric mood.       Objective:   Physical Exam  Constitutional: She is oriented to person, place, and time. She appears well-developed and well-nourished.  HENT:  Head: Normocephalic and atraumatic.  Right Ear: External ear normal.  Left Ear: External ear normal.  Mouth/Throat: Oropharynx is clear and moist.  Eyes: EOM are normal. Pupils are equal, round, and reactive to light.  Neck: Normal range of motion. Neck supple. No thyromegaly present.  Cardiovascular: Normal rate, regular rhythm and normal heart sounds.   No murmur heard. Pulmonary/Chest: Breath sounds normal. No respiratory distress. She has no wheezes. She has no rales.  Abdominal: Soft. Bowel sounds are normal. She exhibits no distension and no mass. There is no tenderness. There is no rebound and no guarding.  Musculoskeletal: Normal range of motion. She exhibits no edema.  Lymphadenopathy:    She has no cervical adenopathy.  Neurological: She is alert and oriented to person, place, and time. She displays normal reflexes. No cranial nerve deficit.  Skin: No rash noted.  Psychiatric: She has a normal mood and affect. Her behavior is normal. Judgment and thought content normal.          Assessment & Plan:  Complete physical. Flu vaccine given. Prevnar 13 given. Schedule repeat mammogram. Obtain  screening lab work. She is encouraged to lose some weight. Refill lisinopril HCTZ for one year.

## 2015-08-03 ENCOUNTER — Ambulatory Visit (INDEPENDENT_AMBULATORY_CARE_PROVIDER_SITE_OTHER): Payer: Medicare Other | Admitting: Adult Health

## 2015-08-03 ENCOUNTER — Encounter: Payer: Self-pay | Admitting: Adult Health

## 2015-08-03 VITALS — BP 130/84 | Temp 98.2°F | Ht <= 58 in | Wt 155.8 lb

## 2015-08-03 DIAGNOSIS — R300 Dysuria: Secondary | ICD-10-CM | POA: Diagnosis not present

## 2015-08-03 LAB — POCT URINALYSIS DIPSTICK
BILIRUBIN UA: NEGATIVE
Glucose, UA: NEGATIVE
Ketones, UA: NEGATIVE
Nitrite, UA: NEGATIVE
PH UA: 7
Protein, UA: NEGATIVE
RBC UA: NEGATIVE
SPEC GRAV UA: 1.01
Urobilinogen, UA: 0.2

## 2015-08-03 MED ORDER — CIPROFLOXACIN HCL 500 MG PO TABS: 500.0000 mg | ORAL_TABLET | Freq: Two times a day (BID) | ORAL | Status: DC

## 2015-08-03 NOTE — Patient Instructions (Signed)

## 2015-08-03 NOTE — Progress Notes (Signed)
  ZOX:WRUEAVWUJ,WJXBJPCP:BURCHETTE,Andrea W, MD Chief Complaint  Patient presents with  . Dysuria  . Epistaxis    Current Issues:  Presents with **4 days of dysuria and urinary urgency Associated symptoms include:  flank pain bilaterally, urinary frequency and urinary urgency  There is a previous history of of similar symptoms. Sexually active:  No   No concern for STI.  Prior to Admission medications   Medication Sig Start Date End Date Taking? Authorizing Provider  lisinopril-hydrochlorothiazide (PRINZIDE,ZESTORETIC) 20-12.5 MG tablet Take 1 tablet by mouth at bedtime. 07/11/15  Yes Kristian CoveyBruce [name] Burchette, MD  acetaminophen (TYLENOL) 650 MG CR tablet Take 1,300 mg by mouth every 8 (eight) hours as needed (For arthritis pain.).    Historical Provider, MD  naproxen sodium (ALEVE) 220 MG tablet Take 220 mg by mouth every 8 (eight) hours as needed (For pain.).    Historical Provider, MD    Review of Systems:negative unless mentioned above  PE:  BP 130/84 mmHg  Temp(Src) 98.2 F (36.8 C) (Oral)  Ht 4\' 8"  (1.422 m)  Wt 155 lb 12.8 oz (70.67 kg)  BMI 34.95 kg/m2 Constitutional: Alert and oriented x 4. No acute distress Heart: Normal rate, normal rhythm, No murmurs, rubs, gallops Lungs" Clear to auscultation Back. No CVA tenderness Abdomen: No pain with palpation. No masses.    Results for orders placed or performed in visit on 08/03/15  POCT urinalysis dipstick  Result Value Ref Range   Color, UA yellow    Clarity, UA cloudy    Glucose, UA n    Bilirubin, UA n    Ketones, UA n    Spec Grav, UA 1.010    Blood, UA n    pH, UA 7.0    Protein, UA n    Urobilinogen, UA 0.2    Nitrite, UA n    Leukocytes, UA large (3+) (A) Negative    Assessment and Plan:  1. Dysuria  1. Dysuria  - POCT urinalysis dipstick; Standing - POCT urinalysis dipstick - + for Leuks - Culture, Urine - ciprofloxacin (CIPRO) 500 MG tablet; Take 1 tablet (500 mg total) by mouth 2 (two) times daily.  Dispense: 6 tablet;  Refill: 0 - Follow up if no improvement in 2-3 days

## 2015-08-03 NOTE — Progress Notes (Signed)
Pre visit review using our clinic review tool, if applicable. No additional management support is needed unless otherwise documented below in the visit note. 

## 2015-08-06 LAB — URINE CULTURE: Colony Count: >=100000

## 2015-08-08 LAB — HM MAMMOGRAPHY: HM MAMMO: NORMAL

## 2015-08-10 ENCOUNTER — Encounter: Payer: Self-pay | Admitting: Family Medicine

## 2015-12-20 IMAGING — CT CT ABD-PELV W/ CM
1 of 3 series · 14 of 32 positions shown, 19 images · IV contrast (OMNIPAQUE 300)
Comparison: None.

CLINICAL DATA: Right lower quadrant pain

EXAM:
CT ABDOMEN AND PELVIS WITH CONTRAST
TECHNIQUE: Multidetector CT imaging of the abdomen and pelvis was performed
using the standard protocol following bolus administration of
intravenous contrast.
CONTRAST:  100mL OMNIPAQUE IOHEXOL 300 MG/ML  SOLN

[Series 2: abd/pel with · axial · 0.73mm/px · z∈[-396,-46]mm · 14 of 78 slices shown, 19 images]
[im 4/78  soft-tissue]
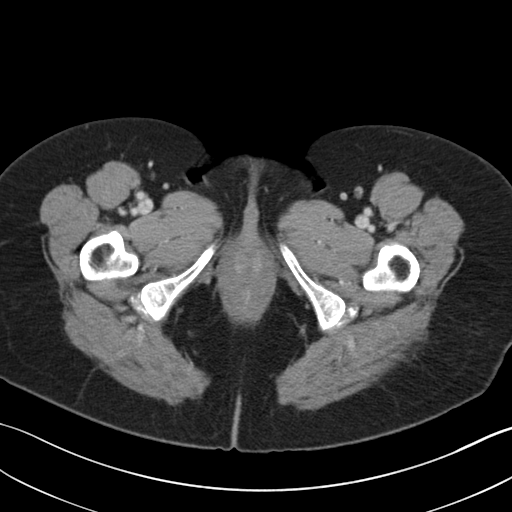
[im 4/78  bone]
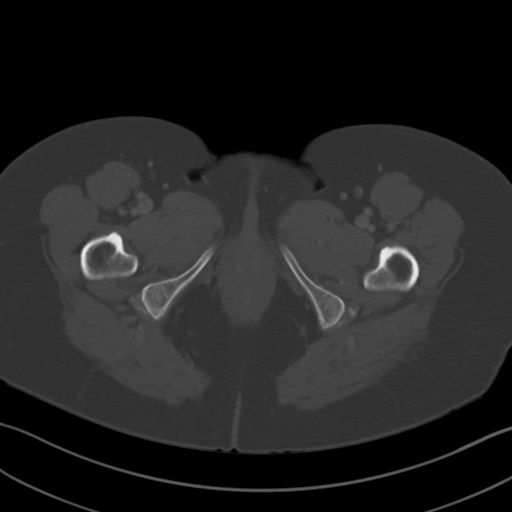
[im 12/78  soft-tissue]
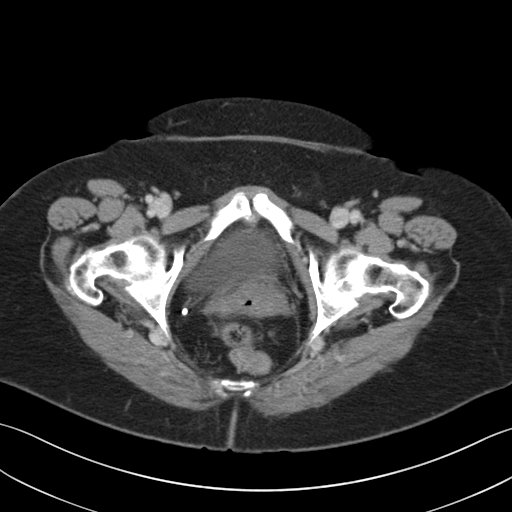
[im 16/78  soft-tissue]
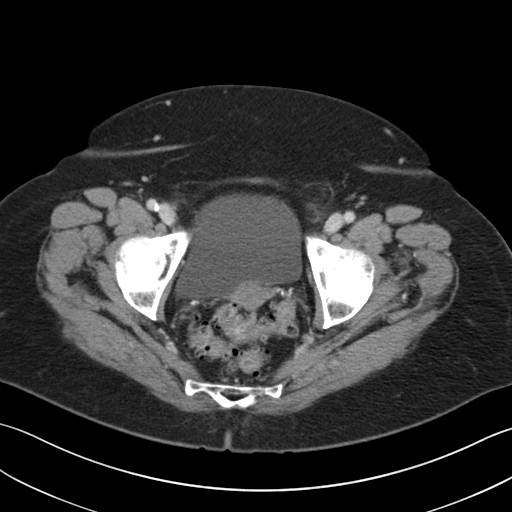
[im 24/78  soft-tissue]
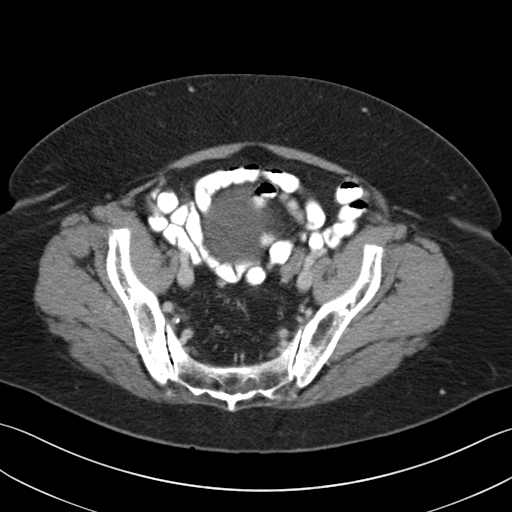
[im 27/78  soft-tissue]
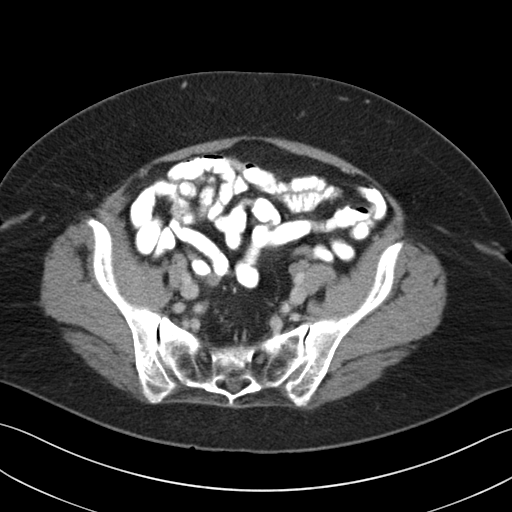
[im 35/78  soft-tissue]
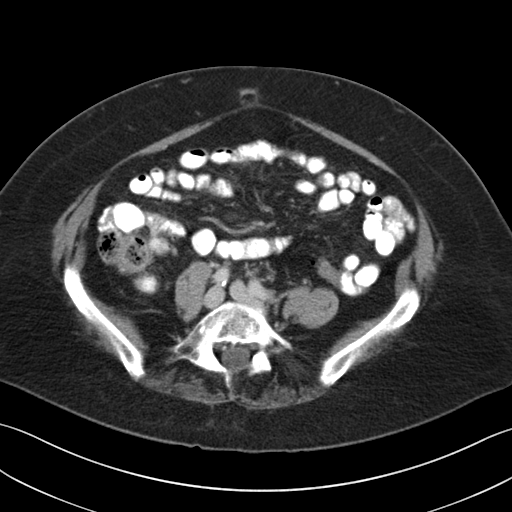
[im 39/78  soft-tissue]
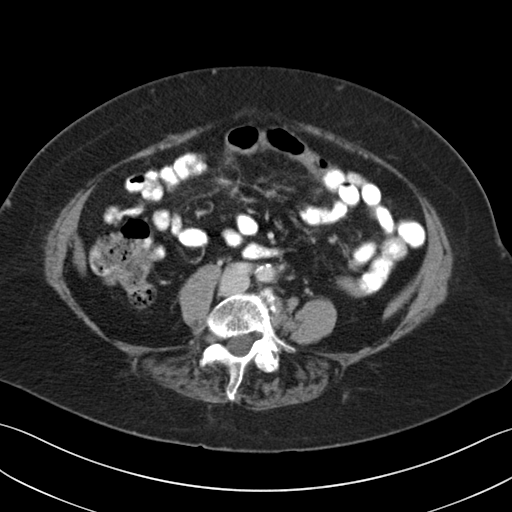
[im 43/78  soft-tissue]
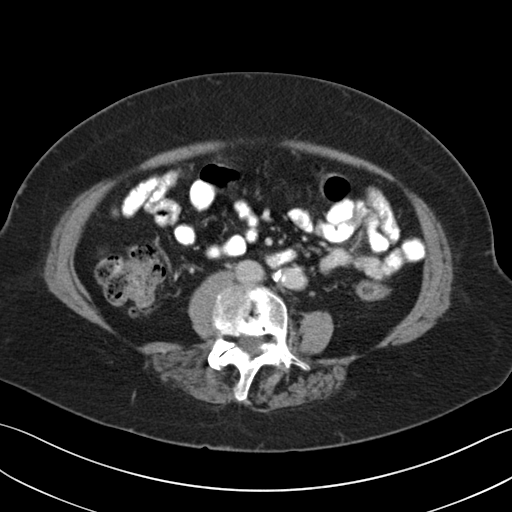
[im 51/78  soft-tissue]
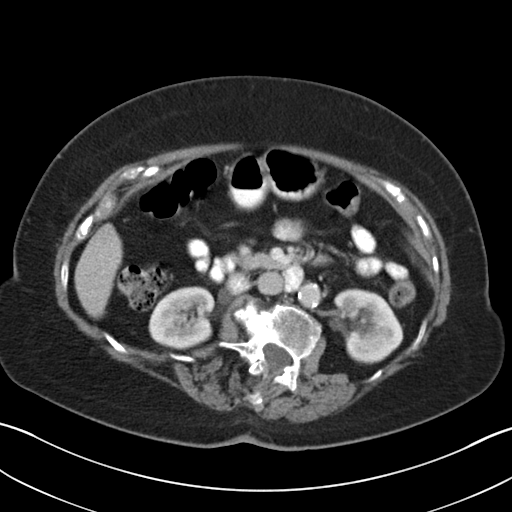
[im 51/78  bone]
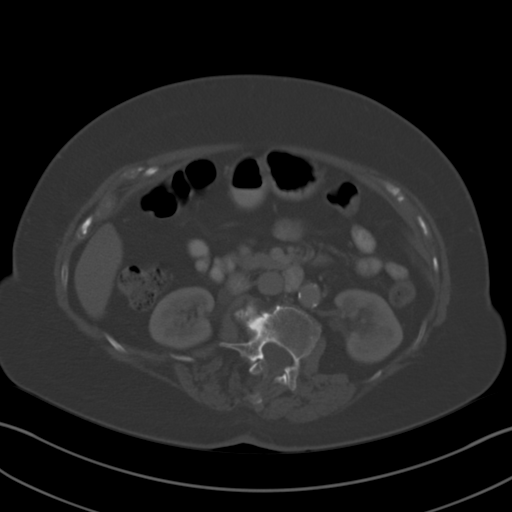
[im 54/78  soft-tissue]
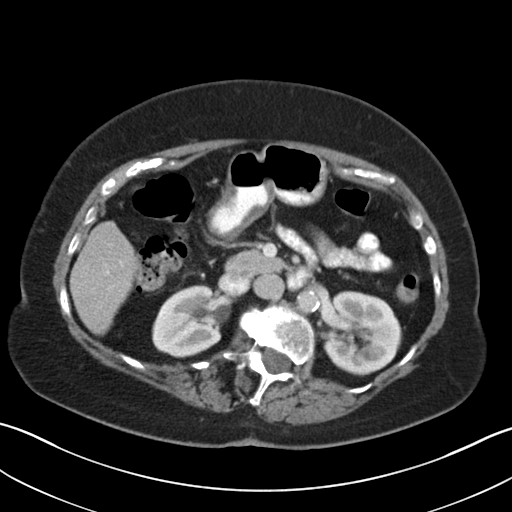
[im 62/78  soft-tissue]
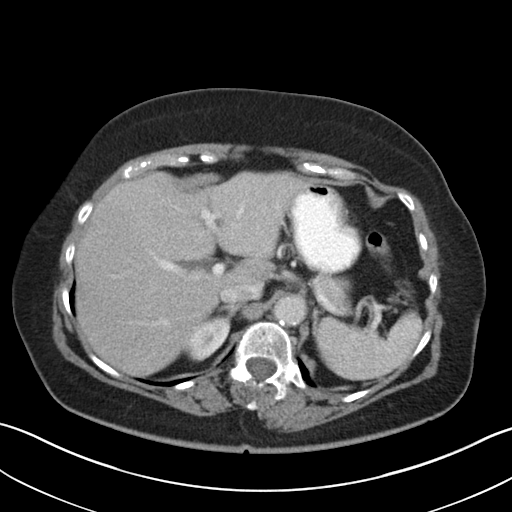
[im 62/78  lung]
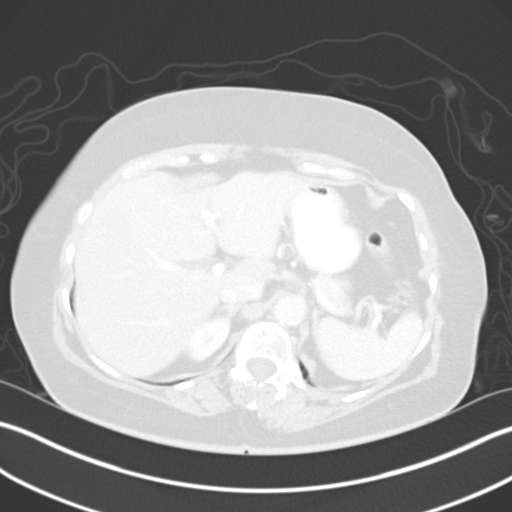
[im 66/78  soft-tissue]
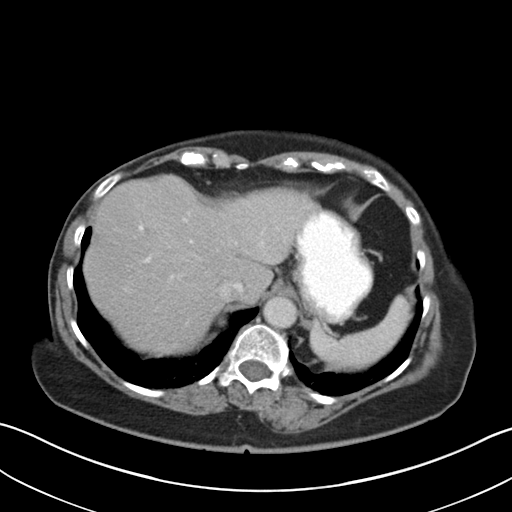
[im 66/78  lung]
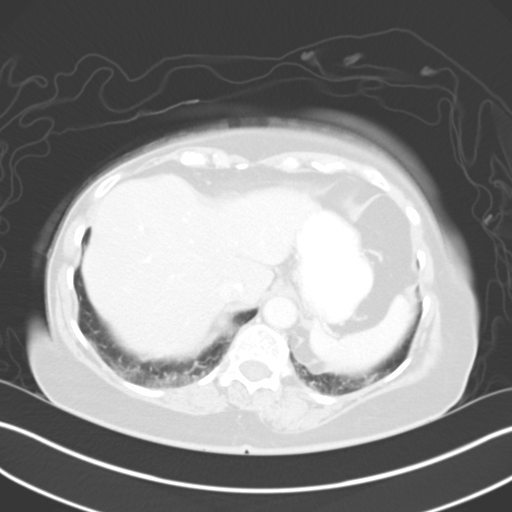
[im 70/78  lung]
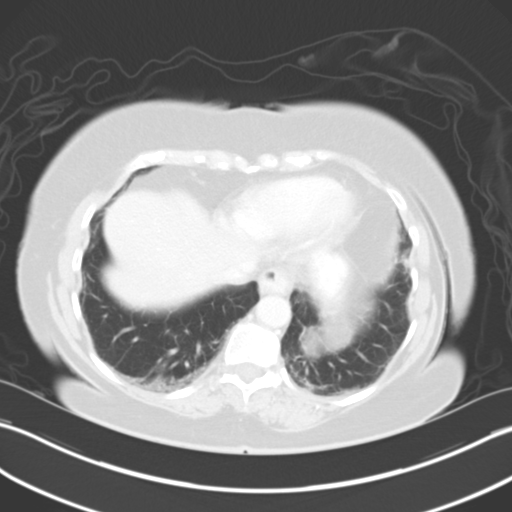
[im 74/78  soft-tissue]
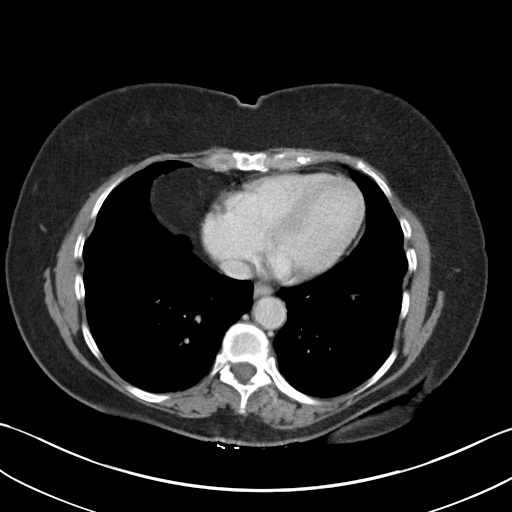
[im 74/78  lung]
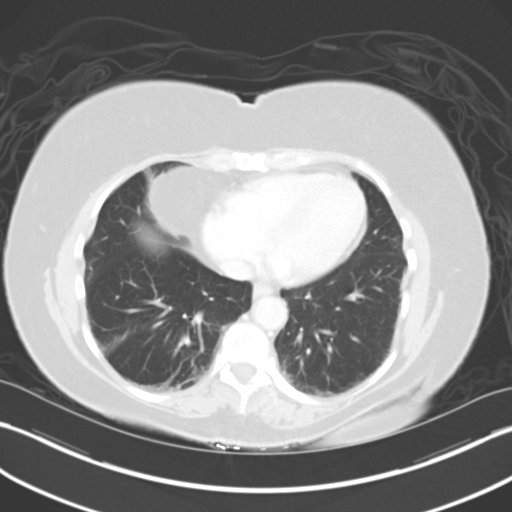

[14 of 32 positions shown; findings below may reference images not displayed]

FINDINGS: Bibasilar atelectasis.

Liver, gallbladder, spleen, pancreas, adrenal glands are within
normal limits. Tiny hypodensities in the kidneys are likely small
cysts.

Normal appendix.

Bladder, uterus, and adnexa are within normal limits.

Sigmoid diverticulosis without acute diverticulitis

No free-fluid.  No abnormal retroperitoneal adenopathy.

Small hiatal hernia.

No vertebral compression.  Scoliosis.

Left inguinal hernia contains only adipose tissue.
IMPRESSION: No acute intra-abdominal pathology.

Normal appendix.

## 2015-12-25 ENCOUNTER — Encounter: Payer: Self-pay | Admitting: Family Medicine

## 2015-12-25 ENCOUNTER — Ambulatory Visit (INDEPENDENT_AMBULATORY_CARE_PROVIDER_SITE_OTHER): Payer: Medicare Other | Admitting: Family Medicine

## 2015-12-25 VITALS — BP 144/92 | HR 84 | Temp 97.9°F | Ht <= 58 in | Wt 159.6 lb

## 2015-12-25 DIAGNOSIS — M545 Low back pain, unspecified: Secondary | ICD-10-CM

## 2015-12-25 DIAGNOSIS — R309 Painful micturition, unspecified: Secondary | ICD-10-CM

## 2015-12-25 LAB — POC URINALSYSI DIPSTICK (AUTOMATED)
BILIRUBIN UA: NEGATIVE
Blood, UA: NEGATIVE
Glucose, UA: NEGATIVE
Ketones, UA: NEGATIVE
LEUKOCYTES UA: NEGATIVE
NITRITE UA: NEGATIVE
PH UA: 7.5
PROTEIN UA: NEGATIVE
Spec Grav, UA: 1.01
Urobilinogen, UA: 0.2

## 2015-12-25 NOTE — Patient Instructions (Signed)
Continue Tylenol as need for pain.  Back Pain, Adult Back pain is very common. The pain often gets better over time. The cause of back pain is usually not dangerous. Most people can learn to manage their back pain on their own.  HOME CARE  Watch your back pain for any changes. The following actions may help to lessen any pain you are feeling:  Stay active. Start with short walks on flat ground if you can. Try to walk farther each day.  Exercise regularly as told by your doctor. Exercise helps your back heal faster. It also helps avoid future injury by keeping your muscles strong and flexible.  Do not sit, drive, or stand in one place for more than 30 minutes.  Do not stay in bed. Resting more than 1-2 days can slow down your recovery.  Be careful when you bend or lift an object. Use good form when lifting:  Bend at your knees.  Keep the object close to your body.  Do not twist.  Sleep on a firm mattress. Lie on your side, and bend your knees. If you lie on your back, put a pillow under your knees.  Take medicines only as told by your doctor.  Put ice on the injured area.  Put ice in a plastic bag.  Place a towel between your skin and the bag.  Leave the ice on for 20 minutes, 2-3 times a day for the first 2-3 days. After that, you can switch between ice and heat packs.  Avoid feeling anxious or stressed. Find good ways to deal with stress, such as exercise.  Maintain a healthy weight. Extra weight puts stress on your back. GET HELP IF:   You have pain that does not go away with rest or medicine.  You have worsening pain that goes down into your legs or buttocks.  You have pain that does not get better in one week.  You have pain at night.  You lose weight.  You have a fever or chills. GET HELP RIGHT AWAY IF:   You cannot control when you poop (bowel movement) or pee (urinate).  Your arms or legs feel weak.  Your arms or legs lose feeling (numbness).  You  feel sick to your stomach (nauseous) or throw up (vomit).  You have belly (abdominal) pain.  You feel like you may pass out (faint).   This information is not intended to replace advice given to you by your health care provider. Make sure you discuss any questions you have with your health care provider.   Document Released: 02/26/2008 Document Revised: 09/30/2014 Document Reviewed: 01/11/2014 Elsevier Interactive Patient Education [employer]2016 [employer] Inc.

## 2015-12-25 NOTE — Progress Notes (Signed)
Subjective:    Patient ID: [name], female    DOB: [birth year], 75 y.o.   MRN: [id number]  HPI  Acute Visit   Patient seen today with a complaint of right sided low back pain times one week. Past medical history osteoporosis and stress incontinence. She is followed by [facility]. She denies any recent falls or injury. Denies chills, fever, urinary urgency,  frequency, or dysuria currently . Mild pain with urination last week.  She took over the counter  AZO  with minimal improvement of symptoms.  She achieved better relief with Aleve and Acetaminophen, although Aleve caused GI upset.  No radiculopathy symptoms.  Recent hip films per [facility] in January showed mild degenerative arthritis of both hips.  Past Medical History  Diagnosis Date  . Hypertension   . Osteoporosis   . Arthritis   . Cataract    Past Surgical History  Procedure Laterality Date  . Joint replacement  2009, 2010    knee replacements  . Nasal sinus surgery    . Vocal cord polyps      REMOVED  . Cataracts    . Colonoscopy  06/2002  . Bunionectomy      reports that she quit smoking about 28 years ago. She has never used smokeless tobacco. She reports that she does not drink alcohol or use illicit drugs. family history includes Arthritis in her father; Cancer in her father; Colon cancer (age of onset: 31) in her sister. Allergies  Allergen Reactions  . Alendronate Sodium     REACTION: constipation  . Celecoxib     REACTION: GI upset  . Penicillins     REACTION: serious dehydration, hospitalized in 1990        Review of Systems  Constitutional: Negative.   HENT: Negative.   Eyes: Negative.   Respiratory: Negative.   Cardiovascular: Negative.   Gastrointestinal: Negative.   Genitourinary:       SEE HPI  Neurological: Negative.   Psychiatric/Behavioral: Negative.        Objective:   Physical Exam  Constitutional: She is oriented to person, place, and time. She appears  well-developed and well-nourished.  HENT:  Head: Normocephalic and atraumatic.  Eyes: Conjunctivae are normal. Pupils are equal, round, and reactive to light.  Neck: Normal range of motion.  Cardiovascular: Normal rate, regular rhythm, normal heart sounds and intact distal pulses.   Pulmonary/Chest: Effort normal and breath sounds normal.  Abdominal: Soft. Bowel sounds are normal. She exhibits no distension. There is no tenderness.  Musculoskeletal: She exhibits no edema or tenderness.  Bilateral lower extremities- full strength against resistance. Internal and external hip rotation-good ROM with no pain. Gait stable and balance.    Neurological: She is alert and oriented to person, place, and time. She has normal reflexes.  Psychiatric: She has a normal mood and affect. Her behavior is normal. Judgment and thought content normal.          Assessment & Plan:  Right sided low back pain- patient seen today with acute onset of low back pain. She was unsure if pain was related to osteoarthritis or an active UTI.  Urine dipstick was negative. Reviewed recent imaging report from [facility] which indicated no recent worsening degenerative changes of patients hips. Pain is likely related to osteoarthritis of the low back as patient reports significant relief with acetaminophen and naproxen.   Plan: Continue Acetaminophen as needed for pain.  Follow-up in two weeks if no improvement.  Avoid regular use of NSAIDS.

## 2016-07-29 ENCOUNTER — Encounter: Payer: Self-pay | Admitting: Family Medicine

## 2016-07-29 ENCOUNTER — Ambulatory Visit (INDEPENDENT_AMBULATORY_CARE_PROVIDER_SITE_OTHER): Payer: Medicare Other | Admitting: Family Medicine

## 2016-07-29 VITALS — BP 110/88 | HR 87 | Temp 97.8°F | Ht <= 58 in | Wt 155.4 lb

## 2016-07-29 DIAGNOSIS — Z0000 Encounter for general adult medical examination without abnormal findings: Secondary | ICD-10-CM | POA: Diagnosis not present

## 2016-07-29 DIAGNOSIS — Z780 Asymptomatic menopausal state: Secondary | ICD-10-CM

## 2016-07-29 DIAGNOSIS — R300 Dysuria: Secondary | ICD-10-CM

## 2016-07-29 DIAGNOSIS — Z23 Encounter for immunization: Secondary | ICD-10-CM | POA: Diagnosis not present

## 2016-07-29 LAB — CBC WITH DIFFERENTIAL/PLATELET
Basophils Absolute: 0 10*3/uL (ref 0.0–0.1)
Basophils Relative: 0.9 % (ref 0.0–3.0)
EOS ABS: 0.1 10*3/uL (ref 0.0–0.7)
Eosinophils Relative: 2.2 % (ref 0.0–5.0)
HEMATOCRIT: 45.5 % (ref 36.0–46.0)
Hemoglobin: 15.2 g/dL — ABNORMAL HIGH (ref 12.0–15.0)
LYMPHS ABS: 1.9 10*3/uL (ref 0.7–4.0)
Lymphocytes Relative: 36.4 % (ref 12.0–46.0)
MCHC: 33.5 g/dL (ref 30.0–36.0)
MCV: 96.7 fl (ref 78.0–100.0)
MONOS PCT: 9.7 % (ref 3.0–12.0)
Monocytes Absolute: 0.5 10*3/uL (ref 0.1–1.0)
NEUTROS ABS: 2.6 10*3/uL (ref 1.4–7.7)
Neutrophils Relative %: 50.8 % (ref 43.0–77.0)
PLATELETS: 232 10*3/uL (ref 150.0–400.0)
RBC: 4.71 Mil/uL (ref 3.87–5.11)
RDW: 14.3 % (ref 11.5–15.5)
WBC: 5.2 10*3/uL (ref 4.0–10.5)

## 2016-07-29 LAB — HEPATIC FUNCTION PANEL
ALBUMIN: 4.5 g/dL (ref 3.5–5.2)
ALT: 20 U/L (ref 0–35)
AST: 17 U/L (ref 0–37)
Alkaline Phosphatase: 63 U/L (ref 39–117)
Bilirubin, Direct: 0.1 mg/dL (ref 0.0–0.3)
TOTAL PROTEIN: 6.7 g/dL (ref 6.0–8.3)
Total Bilirubin: 0.6 mg/dL (ref 0.2–1.2)

## 2016-07-29 LAB — POCT URINALYSIS DIPSTICK
Bilirubin, UA: NEGATIVE
Glucose, UA: NEGATIVE
Ketones, UA: NEGATIVE
Nitrite, UA: NEGATIVE
PH UA: 7.5
PROTEIN UA: NEGATIVE
RBC UA: NEGATIVE
SPEC GRAV UA: 1.015
Urobilinogen, UA: 0.2

## 2016-07-29 LAB — BASIC METABOLIC PANEL
BUN: 12 mg/dL (ref 6–23)
CHLORIDE: 102 meq/L (ref 96–112)
CO2: 30 meq/L (ref 19–32)
Calcium: 10.4 mg/dL (ref 8.4–10.5)
Creatinine, Ser: 0.63 mg/dL (ref 0.40–1.20)
GFR: 97.66 mL/min (ref >60.00)
Glucose, Bld: 96 mg/dL (ref 70–99)
Potassium: 4.2 mEq/L (ref 3.5–5.1)
SODIUM: 140 meq/L (ref 135–145)

## 2016-07-29 LAB — LIPID PANEL
CHOLESTEROL: 232 mg/dL — AB (ref 0–200)
HDL: 58.7 mg/dL (ref >39.00)
LDL CALC: 148 mg/dL — AB (ref 0–99)
NonHDL: 172.82
TRIGLYCERIDES: 123 mg/dL (ref 0.0–149.0)
Total CHOL/HDL Ratio: 4
VLDL: 24.6 mg/dL (ref 0.0–40.0)

## 2016-07-29 LAB — TSH: TSH: 2.1 u[IU]/mL (ref 0.35–4.50)

## 2016-07-29 NOTE — Progress Notes (Signed)
Pre visit review using our clinic review tool, if applicable. No additional management support is needed unless otherwise documented below in the visit note. 

## 2016-07-29 NOTE — Progress Notes (Signed)
Subjective:     Patient ID: [name], female   DOB: 1941-07-02, 75 y.o.   MRN: [id number]  HPI Patient seen for physical exam. She has past history of obesity, urine incontinence, hypertension, dyslipidemia. She has history of osteopenia and has not had bone density scan in almost 4 years. She does not currently take calcium and vitamin D. Blood pressures been well controlled with lisinopril HCTZ. She needs flu vaccination. Other immunizations are up-to-date. No recent falls. No depression issues. She is widowed. Her son lives across the street.  She has hx of chronic urine incontinence and has been seen by urology for that.  Mild burning with urine past couple of days. No fever.  Past Medical History:  Diagnosis Date  . Arthritis   . Cataract   . Hypertension   . Osteoporosis    Past Surgical History:  Procedure Laterality Date  . BUNIONECTOMY    . CATARACTS    . COLONOSCOPY  06/2002  . JOINT REPLACEMENT  2009, 2010   knee replacements  . NASAL SINUS SURGERY    . VOCAL CORD POLYPS     REMOVED    reports that she quit smoking about 28 years ago. She has a 1.50 pack-year smoking history. She has never used smokeless tobacco. She reports that she does not drink alcohol or use drugs. family history includes Arthritis in her father; Cancer in her father; Colon cancer (age of onset: 2277) in her sister. Allergies  Allergen Reactions  . Alendronate Sodium     REACTION: constipation  . Celecoxib     REACTION: GI upset  . Penicillins     REACTION: serious dehydration, hospitalized in 1990     Review of Systems  Constitutional: Negative for activity change, appetite change, fatigue, fever and unexpected weight change.  HENT: Negative for ear pain, hearing loss, sore throat and trouble swallowing.   Eyes: Negative for visual disturbance.  Respiratory: Negative for cough and shortness of breath.   Cardiovascular: Negative for chest pain and palpitations.  Gastrointestinal:  Negative for abdominal pain, blood in stool, constipation and diarrhea.  Endocrine: Negative for polydipsia and polyuria.  Genitourinary: Positive for dysuria (omplains of some recent mild burning with urination). Negative for hematuria.  Musculoskeletal: Negative for arthralgias, back pain and myalgias.  Skin: Negative for rash.  Neurological: Negative for dizziness, syncope and headaches.  Hematological: Negative for adenopathy.  Psychiatric/Behavioral: Negative for confusion and dysphoric mood.       Objective:   Physical Exam  Constitutional: She is oriented to person, place, and time. She appears well-developed and well-nourished.  HENT:  Head: Normocephalic and atraumatic.  Eyes: EOM are normal. Pupils are equal, round, and reactive to light.  Neck: Normal range of motion. Neck supple. No thyromegaly present.  Cardiovascular: Normal rate, regular rhythm and normal heart sounds.   No murmur heard. Pulmonary/Chest: Breath sounds normal. No respiratory distress. She has no wheezes. She has no rales.  Abdominal: Soft. Bowel sounds are normal. She exhibits no distension and no mass. There is no tenderness. There is no rebound and no guarding.  Musculoskeletal: Normal range of motion. She exhibits no edema.  Lymphadenopathy:    She has no cervical adenopathy.  Neurological: She is alert and oriented to person, place, and time. She displays normal reflexes. No cranial nerve deficit.  Skin: No rash noted.  Psychiatric: She has a normal mood and affect. Her behavior is normal. Judgment and thought content normal.  Assessment:     Physical exam.  Hx of osteopenia with no recent DEXA scan.  .trace Leukocytes on urine dip.    Plan:     -flu vaccine given -get screening labs -Set up DEXA -continue with at least every other year mammogram. -reminder for daily calcium and Vit D. -get urine culture.  Kristian CoveyBruce W Suzzette Gasparro MD [facility]

## 2016-07-29 NOTE — Patient Instructions (Signed)
Set up bone density scan We will call you with labs done today.

## 2016-07-30 LAB — URINE CULTURE: Organism ID, Bacteria: NO GROWTH

## 2016-08-28 LAB — HM DEXA SCAN

## 2016-09-02 ENCOUNTER — Other Ambulatory Visit: Payer: Self-pay | Admitting: Family Medicine

## 2016-09-10 ENCOUNTER — Encounter: Payer: Self-pay | Admitting: Family Medicine

## 2016-10-01 ENCOUNTER — Ambulatory Visit: Payer: Medicare Other | Attending: Sports Medicine | Admitting: Physical Therapy

## 2016-10-01 DIAGNOSIS — M6281 Muscle weakness (generalized): Secondary | ICD-10-CM | POA: Insufficient documentation

## 2016-10-01 DIAGNOSIS — M25571 Pain in right ankle and joints of right foot: Secondary | ICD-10-CM | POA: Insufficient documentation

## 2016-10-01 NOTE — Addendum Note (Signed)
Addended by: Smayan Hackbart, [place] W on: 10/01/2016 12:04 PM   Modules accepted: Orders

## 2016-10-01 NOTE — Therapy (Signed)
[facility] [address] [place], [place], [address] Phone: [phone]   Fax:  [phone]  Physical Therapy Evaluation  Patient Details  Name: [name] MRN: [id number] Date of Birth: [birth year] Referring Provider: Rodolph Bong MD.  Encounter Date: 10/01/2016      PT End of Session - 10/01/16 1140    PT Start Time 1024   PT Stop Time 1118   PT Time Calculation (min) 54 min      Past Medical History:  Diagnosis Date  . Arthritis   . Cataract   . Hypertension   . Osteoporosis     Past Surgical History:  Procedure Laterality Date  . BUNIONECTOMY    . CATARACTS    . COLONOSCOPY  06/2002  . JOINT REPLACEMENT  2009, 2010   knee replacements  . NASAL SINUS SURGERY    . VOCAL CORD POLYPS     REMOVED    There were no vitals filed for this visit.       Subjective Assessment - 10/01/16 1038    Subjective The patient has had ongoing right foot pain over the last 2 years.  She has a low resting pain-level of 2/10 but has had times recently over the holidays when she was on her feet a lot in which her pain rose to a 7+/10.  She has good supportive footwear and an ASO that can be inflated to support her arch.  Excessive standing and walking increase her pain and heat and rest decrease her pain.  The patient states she may consider having an orthotic made.   Pertinent History H/o right low back pain with sciatica.     Patient Stated Goals Walk and stand without right foot pain.   Currently in Pain? Yes   Pain Score 2    Pain Location Ankle   Pain Orientation Right   Pain Descriptors / Indicators Aching   Pain Onset More than a month ago   Pain Frequency Constant   Aggravating Factors  See above.            [facility] PT Assessment - 10/01/16 0001      Assessment   Medical Diagnosis Right tibilais post tendonitis.   Referring Provider Rodolph Bong MD.   Onset Date/Surgical Date --  2 years.     Precautions   Precautions --  OP.     Restrictions   Weight Bearing Restrictions No     Balance Screen   Has the patient fallen in the past 6 months No   Has the patient had a decrease in activity level because of a fear of falling?  No   Is the patient reluctant to leave their home because of a fear of falling?  No     Home Environment   Living Environment Private residence     Prior Function   Level of Independence Independent     Posture/Postural Control   Posture Comments Pronation of right ankle.     ROM / Strength   AROM / PROM / Strength AROM;Strength     AROM   Overall AROM Comments Full actie right ankle range of motion.     Strength   Overall Strength Comments 4/5 strength grade for right nakle inversion.     Palpation   Palpation comment Tender to palpation over right distal calf region/tib post region and also posterior to medial malleolus.     Ambulation/Gait   Gait Comments Minimal decrease in right stance time.  [facility] Adult PT Treatment/Exercise - 10/01/16 0001      Modalities   Modalities Electrical Stimulation;Moist Heat     Moist Heat Therapy   Number Minutes Moist Heat 20 Minutes   Moist Heat Location --  Right tib post near medial malleolus.     Programme researcher, broadcasting/film/videolectrical Stimulation   Electrical Stimulation Location --  Right tib post.   Statisticianlectrical Stimulation Action --  Constant pre-mod. x 20 minutes at 80-150 Hz.                  PT Short Term Goals - 10/01/16 1111      PT SHORT TERM GOAL #1   Title STG's=LTG's.           PT Long Term Goals - 10/01/16 1111      PT LONG TERM GOAL #1   Title Independent with a HEP.   Time 8   Period Weeks   Status New     PT LONG TERM GOAL #2   Title Stand 30 minutes with pain not > 2-3/10.   Time 8   Period Weeks   Status New     PT LONG TERM GOAL #3   Title Walk a community distance with pain not > 3/10.   Time 8   Period Weeks   Status New     PT LONG TERM GOAL #4    Title Increase right ankle strength to 4+ to 5/5 to increase stability for functional tasks   Time 8   Period Weeks   Status New               Plan - 10/01/16 1107    Clinical Impression Statement The patient is palpably tender over her right tibilais posterior in distal calf region and posterior to medial malleolus.  She stands in significant pronation.  Her range of motion is very good and has just a minimal decrease in inversion strength.   Rehab Potential Good   PT Frequency 3x / week   PT Duration 4 weeks   PT Treatment/Interventions ADLs/Self Care Home Management;Cryotherapy;Electrical Stimulation;Iontophoresis 4mg /ml Dexamethasone;Moist Heat;Ultrasound;Therapeutic activities;Therapeutic exercise;Patient/family education;Manual techniques;Dry needling;Vasopneumatic Device   PT Next Visit Plan Sending order for Iontophoresis (please check under "other orders" tab); IASTM/STW/M to right distal medial calf and tib post; Combo e'stim/U/S; moist heat and e'stim; right ankle strengthening.  Dry needling if deemed necessary.      Patient will benefit from skilled therapeutic intervention in order to improve the following deficits and impairments:  Pain, Decreased activity tolerance  Visit Diagnosis: Pain in right ankle and joints of right foot - Plan: PT plan of care cert/re-cert  Muscle weakness (generalized) - Plan: PT plan of care cert/re-cert      G-Codes - 10/01/16 1125    Functional Assessment Tool Used FOTO....56% limitation.   Functional Limitation Mobility: Walking and moving around   Mobility: Walking and Moving Around Current Status [phone](G8978) At least 40 percent but less than 60 percent impaired, limited or restricted   Mobility: Walking and Moving Around Goal Status [phone](G8979) At least 1 percent but less than 20 percent impaired, limited or restricted       Problem List Patient Active Problem List   Diagnosis Date Noted  . Obesity (BMI 30-39.9) 08/10/2013  . SUI  (stress urinary incontinence, female) 05/21/2012  . UNSPECIFIED URINARY INCONTINENCE 12/19/2009  . ALLERGIC RHINITIS CAUSE UNSPECIFIED 04/05/2009  . HYPERLIPIDEMIA 02/13/2009  . ECZEMA 02/13/2009  . OSTEOPOROSIS 02/13/2009  . Unspecified essential hypertension 01/04/2009  Kassady Laboy, [place] MPT 10/01/2016, 11:50 AM  [facility] [address] [place], [place], [address] Phone: [phone]   Fax:  [phone]  Name: [name] MRN: [id number] Date of Birth: [birth year]

## 2016-10-03 ENCOUNTER — Ambulatory Visit: Payer: Medicare Other | Admitting: *Deleted

## 2016-10-03 DIAGNOSIS — M6281 Muscle weakness (generalized): Secondary | ICD-10-CM

## 2016-10-03 DIAGNOSIS — M25571 Pain in right ankle and joints of right foot: Secondary | ICD-10-CM | POA: Diagnosis not present

## 2016-10-03 NOTE — Therapy (Signed)
[facility] [address] [place], [place], [address] Phone: [phone]   Fax:  [phone]  Physical Therapy Treatment  Patient Details  Name: [name] MRN: [id number] Date of Birth: 1941/01/20 Referring Provider: Rodolph BongAdam Kendall MD.  Encounter Date: 10/03/2016      PT End of Session - 10/03/16 1345    Visit Number 2   Number of Visits 12   Date for PT Re-Evaluation 11/30/16   PT Start Time 1300   PT Stop Time 1352   PT Time Calculation (min) 52 min      Past Medical History:  Diagnosis Date  . Arthritis   . Cataract   . Hypertension   . Osteoporosis     Past Surgical History:  Procedure Laterality Date  . BUNIONECTOMY    . CATARACTS    . COLONOSCOPY  06/2002  . JOINT REPLACEMENT  2009, 2010   knee replacements  . NASAL SINUS SURGERY    . VOCAL CORD POLYPS     REMOVED    There were no vitals filed for this visit.      Subjective Assessment - 10/03/16 1258    Subjective The patient has had ongoing right foot pain over the last 2 years.  She has a low resting pain-level of 2/10 but has had times recently over the holidays when she was on her feet a lot in which her pain rose to a 7+/10.  She has good supportive footwear and an ASO that can be inflated to support her arch.  Excessive standing and walking increase her pain and heat and rest decrease her pain.  The patient states she may consider having an orthotic made.   Pertinent History H/o right low back pain with sciatica.     Patient Stated Goals Walk and stand without right foot pain.   Currently in Pain? Yes   Pain Score 2    Pain Location Ankle   Pain Orientation Right   Pain Descriptors / Indicators Aching   Pain Onset More than a month ago   Pain Frequency Constant                         [facility] Adult PT Treatment/Exercise - 10/03/16 0001      Modalities   Modalities Electrical Stimulation;Ultrasound;Moist Heat     Moist Heat  Therapy   Number Minutes Moist Heat 15 Minutes   Moist Heat Location Ankle     Electrical Stimulation   Electrical Stimulation Location RT ankle/Tib. post. Premod 80-150hz  x 15 mins   Electrical Stimulation Goals Pain     Ultrasound   Ultrasound Location 1.5 w/cm2 x 10 mins at 3.3hz  to RT tib. post  insertion     Manual Therapy   Manual Therapy Soft tissue mobilization;Myofascial release   Soft tissue mobilization STW / IASTM to RT  Tib post belly and along tendon into insertion                  PT Short Term Goals - 10/01/16 1111      PT SHORT TERM GOAL #1   Title STG's=LTG's.           PT Long Term Goals - 10/01/16 1111      PT LONG TERM GOAL #1   Title Independent with a HEP.   Time 8   Period Weeks   Status New     PT LONG TERM GOAL #2   Title  Stand 30 minutes with pain not > 2-3/10.   Time 8   Period Weeks   Status New     PT LONG TERM GOAL #3   Title Walk a community distance with pain not > 3/10.   Time 8   Period Weeks   Status New     PT LONG TERM GOAL #4   Title Increase right ankle strength to 4+ to 5/5 to increase stability for functional tasks   Time 8   Period Weeks   Status New               Plan - 10/03/16 1345    Clinical Impression Statement Pt did great with Rx today and had decreased soreness along Tibialis posterior during STW. She did not have air brace donned, but states she has been wearing it.   Rehab Potential Good   PT Frequency 3x / week   PT Duration 4 weeks   PT Treatment/Interventions ADLs/Self Care Home Management;Cryotherapy;Electrical Stimulation;Iontophoresis 4mg /ml Dexamethasone;Moist Heat;Ultrasound;Therapeutic activities;Therapeutic exercise;Patient/family education;Manual techniques;Dry needling;Vasopneumatic Device   PT Next Visit Plan Sending order for Iontophoresis (please check under "other orders" tab); IASTM/STW/M to right distal medial calf and tib post; Combo e'stim/U/S; moist heat and e'stim;  right ankle strengthening.  Dry needling if deemed necessary.   Consulted and Agree with Plan of Care Patient      Patient will benefit from skilled therapeutic intervention in order to improve the following deficits and impairments:  Pain, Decreased activity tolerance  Visit Diagnosis: Pain in right ankle and joints of right foot  Muscle weakness (generalized)     Problem List Patient Active Problem List   Diagnosis Date Noted  . Obesity (BMI 30-39.9) 08/10/2013  . SUI (stress urinary incontinence, female) 05/21/2012  . UNSPECIFIED URINARY INCONTINENCE 12/19/2009  . ALLERGIC RHINITIS CAUSE UNSPECIFIED 04/05/2009  . HYPERLIPIDEMIA 02/13/2009  . ECZEMA 02/13/2009  . OSTEOPOROSIS 02/13/2009  . Unspecified essential hypertension 01/04/2009    RAMSEUR,CHRIS, PTA 10/03/2016, 2:03 PM  [facility] [address] [place], [place], [address] Phone: [phone]   Fax:  [phone]  Name: [name] MRN: [id number] Date of Birth: 07-13-1941

## 2016-10-08 ENCOUNTER — Ambulatory Visit: Payer: Medicare Other | Admitting: *Deleted

## 2016-10-08 DIAGNOSIS — M25571 Pain in right ankle and joints of right foot: Secondary | ICD-10-CM | POA: Diagnosis not present

## 2016-10-08 DIAGNOSIS — M6281 Muscle weakness (generalized): Secondary | ICD-10-CM

## 2016-10-08 NOTE — Therapy (Signed)
[facility] [address] [place], [place], [address] Phone: [phone]   Fax:  [phone]  Physical Therapy Treatment  Patient Details  Name: [name] MRN: [id number] Date of Birth: [birth year] Referring Provider: Rodolph Bong MD.  Encounter Date: 10/08/2016      PT End of Session - 10/08/16 1042    Visit Number 3   Number of Visits 12   Date for PT Re-Evaluation 11/30/16   PT Start Time 0945   PT Stop Time 1035   PT Time Calculation (min) 50 min      Past Medical History:  Diagnosis Date  . Arthritis   . Cataract   . Hypertension   . Osteoporosis     Past Surgical History:  Procedure Laterality Date  . BUNIONECTOMY    . CATARACTS    . COLONOSCOPY  06/2002  . JOINT REPLACEMENT  2009, 2010   knee replacements  . NASAL SINUS SURGERY    . VOCAL CORD POLYPS     REMOVED    There were no vitals filed for this visit.      Subjective Assessment - 10/08/16 0958    Subjective The patient has had ongoing right foot pain over the last 2 years.  She has a low resting pain-level of 2/10 but has had times recently over the holidays when she was on her feet a lot in which her pain rose to a 7+/10.  She has good supportive footwear and an ASO that can be inflated to support her arch.  Excessive standing and walking increase her pain and heat and rest decrease her pain.  The patient states she may consider having an orthotic made.   Patient Stated Goals Walk and stand without right foot pain.   Currently in Pain? Yes   Pain Score 2    Pain Location Ankle   Pain Orientation Right   Pain Descriptors / Indicators Aching   Pain Onset More than a month ago   Pain Frequency Constant                         [facility] Adult PT Treatment/Exercise - 10/08/16 0001      Exercises   Exercises Ankle     Modalities   Modalities Electrical Stimulation;Ultrasound;Moist Heat     Moist Heat Therapy   Number Minutes  Moist Heat 15 Minutes   Moist Heat Location Ankle     Electrical Stimulation   Electrical Stimulation Location RT ankle/Tib. post. Premod 80-150hz  x 15 mins   Electrical Stimulation Goals Pain     Ultrasound   Ultrasound Location 1.5 w/cm2 x10 min 3.24mhz   Ultrasound Goals Pain     Manual Therapy   Manual Therapy Soft tissue mobilization;Myofascial release   Soft tissue mobilization STW / IASTM to RT  Tib post belly and along tendon into insertion     Ankle Exercises: Seated   Other Seated Ankle Exercises Plantar flexion and inversion with yellow tband 3x10 each for HEP                PT Education - 10/08/16 1041    Education provided Yes   Education Details PF/INV with yellow tband   Person(s) Educated Patient   Methods Explanation;Demonstration;Tactile cues;Verbal cues;Handout   Comprehension Verbalized understanding;Returned demonstration          PT Short Term Goals - 10/01/16 1111      PT SHORT TERM GOAL #1  Title STG's=LTG's.           PT Long Term Goals - 10/01/16 1111      PT LONG TERM GOAL #1   Title Independent with a HEP.   Time 8   Period Weeks   Status New     PT LONG TERM GOAL #2   Title Stand 30 minutes with pain not > 2-3/10.   Time 8   Period Weeks   Status New     PT LONG TERM GOAL #3   Title Walk a community distance with pain not > 3/10.   Time 8   Period Weeks   Status New     PT LONG TERM GOAL #4   Title Increase right ankle strength to 4+ to 5/5 to increase stability for functional tasks   Time 8   Period Weeks   Status New               Plan - 10/08/16 1045    Clinical Impression Statement Pt doid fairly well again with Rx and feels that they are helping with decreased pain and soreness. She does wear the brace still, but not to PT. Ionto order not signed yet, but will check again next Rx.   Rehab Potential Good   PT Frequency 3x / week   PT Duration 4 weeks   PT Treatment/Interventions ADLs/Self Care  Home Management;Cryotherapy;Electrical Stimulation;Iontophoresis 4mg /ml Dexamethasone;Moist Heat;Ultrasound;Therapeutic activities;Therapeutic exercise;Patient/family education;Manual techniques;Dry needling;Vasopneumatic Device   PT Next Visit Plan Sending order for Iontophoresis (please check under "other orders" tab); IASTM/STW/M to right distal medial calf and tib post; Combo e'stim/U/S; moist heat and e'stim; right ankle strengthening.  Dry needling if deemed necessary.   Consulted and Agree with Plan of Care Patient      Patient will benefit from skilled therapeutic intervention in order to improve the following deficits and impairments:  Pain, Decreased activity tolerance  Visit Diagnosis: Pain in right ankle and joints of right foot  Muscle weakness (generalized)     Problem List Patient Active Problem List   Diagnosis Date Noted  . Obesity (BMI 30-39.9) 08/10/2013  . SUI (stress urinary incontinence, female) 05/21/2012  . UNSPECIFIED URINARY INCONTINENCE 12/19/2009  . ALLERGIC RHINITIS CAUSE UNSPECIFIED 04/05/2009  . HYPERLIPIDEMIA 02/13/2009  . ECZEMA 02/13/2009  . OSTEOPOROSIS 02/13/2009  . Unspecified essential hypertension 01/04/2009    Altonio Schwertner,CHRIS , PTA 10/08/2016, 10:53 AM  [facility] [address] [place], [place], [address] Phone: [phone]   Fax:  [phone]  Name: [name] MRN: [id number] Date of Birth: [birth year]

## 2016-10-10 ENCOUNTER — Encounter: Payer: Medicare Other | Admitting: Physical Therapy

## 2016-10-15 ENCOUNTER — Ambulatory Visit: Payer: Medicare Other | Admitting: *Deleted

## 2016-10-15 DIAGNOSIS — M25571 Pain in right ankle and joints of right foot: Secondary | ICD-10-CM

## 2016-10-15 DIAGNOSIS — M6281 Muscle weakness (generalized): Secondary | ICD-10-CM

## 2016-10-15 NOTE — Therapy (Signed)
[facility] [address] [place], [place], [address] Phone: [phone]   Fax:  [phone]  Physical Therapy Treatment  Patient Details  Name: [name] MRN: [id number] Date of Birth: [birth year] Referring Provider: Rodolph BongAdam Kendall MD.  Encounter Date: 10/15/2016      PT End of Session - 10/15/16 1020    Visit Number 4   Number of Visits 12   Date for PT Re-Evaluation 11/30/16   PT Start Time 0900   PT Stop Time 0950   PT Time Calculation (min) 50 min      Past Medical History:  Diagnosis Date  . Arthritis   . Cataract   . Hypertension   . Osteoporosis     Past Surgical History:  Procedure Laterality Date  . BUNIONECTOMY    . CATARACTS    . COLONOSCOPY  06/2002  . JOINT REPLACEMENT  2009, 2010   knee replacements  . NASAL SINUS SURGERY    . VOCAL CORD POLYPS     REMOVED    There were no vitals filed for this visit.      Subjective Assessment - 10/15/16 0903    Subjective The patient has had ongoing right foot pain over the last 2 years.  She has a low resting pain-level of 2/10 but has had times recently over the holidays when she was on her feet a lot in which her pain rose to a 7+/10.  She has good supportive footwear and an ASO that can be inflated to support her arch.  Excessive standing and walking increase her pain and heat and rest decrease her pain.  The patient states she may consider having an orthotic made.   Pertinent History H/o right low back pain with sciatica.     Patient Stated Goals Walk and stand without right foot pain.   Currently in Pain? Yes   Pain Score 3    Pain Location Ankle   Pain Orientation Right   Pain Descriptors / Indicators Aching   Pain Type Chronic pain   Pain Onset More than a month ago   Pain Frequency Constant                         [facility] Adult PT Treatment/Exercise - 10/15/16 0001      Exercises   Exercises Ankle     Modalities   Modalities  Electrical Stimulation;Ultrasound;Moist Heat;Iontophoresis     Moist Heat Therapy   Number Minutes Moist Heat 15 Minutes   Moist Heat Location Ankle  and calf area     Electrical Stimulation   Electrical Stimulation Location RT ankle/Tib. post. Premod 80-150hz  x 15 mins   Electrical Stimulation Goals Pain     Ultrasound   Ultrasound Location 1.5 w/cm2 x 10 mins 3.433mhz to Tib. post. tendon proximal to RT medial malleolus.   Ultrasound Goals Pain     Iontophoresis   Type of Iontophoresis Dexamethasone   Location RT Tib. Post tendon   Dose 1ml   Time 8     Manual Therapy   Manual Therapy Soft tissue mobilization;Myofascial release   Soft tissue mobilization STW to RT  Tib post belly and along tendon into insertion with more focus on mm belly with active release                  PT Short Term Goals - 10/01/16 1111      PT SHORT TERM GOAL #1   Title  STG's=LTG's.           PT Long Term Goals - 10/01/16 1111      PT LONG TERM GOAL #1   Title Independent with a HEP.   Time 8   Period Weeks   Status New     PT LONG TERM GOAL #2   Title Stand 30 minutes with pain not > 2-3/10.   Time 8   Period Weeks   Status New     PT LONG TERM GOAL #3   Title Walk a community distance with pain not > 3/10.   Time 8   Period Weeks   Status New     PT LONG TERM GOAL #4   Title Increase right ankle strength to 4+ to 5/5 to increase stability for functional tasks   Time 8   Period Weeks   Status New               Plan - 10/15/16 1349    Clinical Impression Statement Pt arrived to clinic today with some soreness in RT foot, but not as much near Tib. posterior insertion. Most of her soreness was along the muscle belly and above medial malleolus. IONTO  was started today 1/6   Rehab Potential Good   PT Frequency 3x / week   PT Duration 4 weeks   PT Treatment/Interventions ADLs/Self Care Home Management;Cryotherapy;Electrical Stimulation;Iontophoresis 4mg /ml  Dexamethasone;Moist Heat;Ultrasound;Therapeutic activities;Therapeutic exercise;Patient/family education;Manual techniques;Dry needling;Vasopneumatic Device   PT Next Visit Plan Started IONTO today 1 of 6,   ; IASTM/STW/M to right distal medial calf and tib post; Combo e'stim/U/S; moist heat and e'stim; right ankle strengthening.  Dry needling if deemed necessary.   Consulted and Agree with Plan of Care Patient      Patient will benefit from skilled therapeutic intervention in order to improve the following deficits and impairments:  Pain, Decreased activity tolerance  Visit Diagnosis: Pain in right ankle and joints of right foot  Muscle weakness (generalized)     Problem List Patient Active Problem List   Diagnosis Date Noted  . Obesity (BMI 30-39.9) 08/10/2013  . SUI (stress urinary incontinence, female) 05/21/2012  . UNSPECIFIED URINARY INCONTINENCE 12/19/2009  . ALLERGIC RHINITIS CAUSE UNSPECIFIED 04/05/2009  . HYPERLIPIDEMIA 02/13/2009  . ECZEMA 02/13/2009  . OSTEOPOROSIS 02/13/2009  . Unspecified essential hypertension 01/04/2009    [name],[name], [name] 10/15/2016, 2:04 PM  [facility] [address] [place], [place], [address] Phone: [phone]   Fax:  [phone]  Name: [name] MRN: [id number] Date of Birth: July 23, 1941

## 2016-10-17 ENCOUNTER — Encounter: Payer: Self-pay | Admitting: Physical Therapy

## 2016-10-17 ENCOUNTER — Ambulatory Visit: Payer: Medicare Other | Admitting: Physical Therapy

## 2016-10-17 DIAGNOSIS — M6281 Muscle weakness (generalized): Secondary | ICD-10-CM

## 2016-10-17 DIAGNOSIS — M25571 Pain in right ankle and joints of right foot: Secondary | ICD-10-CM

## 2016-10-17 NOTE — Therapy (Signed)
[facility] [address] [place], [place], [address] Phone: [phone]   Fax:  [phone]  Physical Therapy Treatment  Patient Details  Name: [name] MRN: [id number] Date of Birth: Mar 31, 1941 Referring Provider: Rodolph BongAdam Kendall MD.  Encounter Date: 10/17/2016      PT End of Session - 10/17/16 1349    Visit Number 5   Number of Visits 12   Date for PT Re-Evaluation 11/30/16   PT Start Time 1316   PT Stop Time 1416   PT Time Calculation (min) 60 min   Activity Tolerance Patient tolerated treatment well   Behavior During Therapy [facility] for tasks assessed/performed      Past Medical History:  Diagnosis Date  . Arthritis   . Cataract   . Hypertension   . Osteoporosis     Past Surgical History:  Procedure Laterality Date  . BUNIONECTOMY    . CATARACTS    . COLONOSCOPY  06/2002  . JOINT REPLACEMENT  2009, 2010   knee replacements  . NASAL SINUS SURGERY    . VOCAL CORD POLYPS     REMOVED    There were no vitals filed for this visit.      Subjective Assessment - 10/17/16 1321    Subjective Patient reported some improvement and has responded well to treatments   Pertinent History H/o right low back pain with sciatica.     Patient Stated Goals Walk and stand without right foot pain.   Currently in Pain? Yes   Pain Score 5    Pain Location Ankle   Pain Orientation Right   Pain Descriptors / Indicators Aching   Pain Type Chronic pain   Pain Onset More than a month ago   Pain Frequency Constant   Aggravating Factors  walking    Pain Relieving Factors at rest                         [facility] Adult PT Treatment/Exercise - 10/17/16 0001      Modalities   Modalities Electrical Stimulation;Moist Heat;Ultrasound;Iontophoresis     Moist Heat Therapy   Number Minutes Moist Heat 15 Minutes   Moist Heat Location Ankle     Electrical Stimulation   Electrical Stimulation Location RT ankle/Tib. post.  Premod 80-150hz  x 15 mins   Electrical Stimulation Goals Pain     Ultrasound   Ultrasound Location post tib tendon to r med malleolus   Ultrasound Parameters 1.5w/cm2/50%/491mhz x5210min   Ultrasound Goals Pain     Iontophoresis   Type of Iontophoresis Dexamethasone   Location RT Tib. Post tendon   Dose 1ml 2 of 6   Time 8     Manual Therapy   Manual Therapy Soft tissue mobilization;Myofascial release   Soft tissue mobilization STW to RT  Tib post belly and along tendon into insertion with more focus on mm belly with active release                  PT Short Term Goals - 10/01/16 1111      PT SHORT TERM GOAL #1   Title STG's=LTG's.           PT Long Term Goals - 10/17/16 1350      PT LONG TERM GOAL #1   Title Independent with a HEP.   Time 8   Period Weeks   Status On-going     PT LONG TERM GOAL #2   Title Stand  30 minutes with pain not > 2-3/10.   Time 8   Period Weeks   Status On-going     PT LONG TERM GOAL #3   Title Walk a community distance with pain not > 3/10.   Time 8   Period Weeks   Status On-going     PT LONG TERM GOAL #4   Title Increase right ankle strength to 4+ to 5/5 to increase stability for functional tasks   Time 8   Period Weeks   Status On-going               Plan - 10/17/16 1351    Clinical Impression Statement Patient tolerated treatment well today and feels like she has less pain overall and noticed improvement. Patient wears aircast yet not all the time. Patient has more pain with prolong walking and standing. Patient has responded well to ionto and continued treatment today. Current goals ongoing due to pain and strength deficts.   Rehab Potential Good   PT Frequency 3x / week   PT Duration 4 weeks   PT Treatment/Interventions ADLs/Self Care Home Management;Cryotherapy;Electrical Stimulation;Iontophoresis 4mg /ml Dexamethasone;Moist Heat;Ultrasound;Therapeutic activities;Therapeutic exercise;Patient/family  education;Manual techniques;Dry needling;Vasopneumatic Device   PT Next Visit Plan cont with POC for IONTO/IASTM/STW/M to right distal medial calf and tib post; Combo e'stim/U/S; moist heat and e'stim; right ankle strengthening.  Dry needling if deemed necessary.   Consulted and Agree with Plan of Care Patient      Patient will benefit from skilled therapeutic intervention in order to improve the following deficits and impairments:  Pain, Decreased activity tolerance  Visit Diagnosis: Pain in right ankle and joints of right foot  Muscle weakness (generalized)     Problem List Patient Active Problem List   Diagnosis Date Noted  . Obesity (BMI 30-39.9) 08/10/2013  . SUI (stress urinary incontinence, female) 05/21/2012  . UNSPECIFIED URINARY INCONTINENCE 12/19/2009  . ALLERGIC RHINITIS CAUSE UNSPECIFIED 04/05/2009  . HYPERLIPIDEMIA 02/13/2009  . ECZEMA 02/13/2009  . OSTEOPOROSIS 02/13/2009  . Unspecified essential hypertension 01/04/2009    Hermelinda Dellen, PTA 10/17/2016, 2:30 PM  [facility] [address] [place], [place], [address] Phone: [phone]   Fax:  [phone]  Name: [name] MRN: [id number] Date of Birth: 1941-07-21

## 2016-10-22 ENCOUNTER — Encounter: Payer: Self-pay | Admitting: Physical Therapy

## 2016-10-22 ENCOUNTER — Ambulatory Visit: Payer: Medicare Other | Admitting: Physical Therapy

## 2016-10-22 DIAGNOSIS — M6281 Muscle weakness (generalized): Secondary | ICD-10-CM

## 2016-10-22 DIAGNOSIS — M25571 Pain in right ankle and joints of right foot: Secondary | ICD-10-CM | POA: Diagnosis not present

## 2016-10-22 NOTE — Therapy (Signed)
[facility] [address] [place], [place], [address] Phone: [phone]   Fax:  [phone]  Physical Therapy Treatment  Patient Details  Name: [name] MRN: [id number] Date of Birth: [birth year] Referring Provider: Rodolph BongAdam Kendall MD.  Encounter Date: 10/22/2016      PT End of Session - 10/22/16 0858    Visit Number 6   Number of Visits 12   Date for PT Re-Evaluation 11/30/16   PT Start Time 0907   PT Stop Time 0953   PT Time Calculation (min) 46 min   Activity Tolerance Patient tolerated treatment well   Behavior During Therapy [facility] for tasks assessed/performed      Past Medical History:  Diagnosis Date  . Arthritis   . Cataract   . Hypertension   . Osteoporosis     Past Surgical History:  Procedure Laterality Date  . BUNIONECTOMY    . CATARACTS    . COLONOSCOPY  06/2002  . JOINT REPLACEMENT  2009, 2010   knee replacements  . NASAL SINUS SURGERY    . VOCAL CORD POLYPS     REMOVED    There were no vitals filed for this visit.      Subjective Assessment - 10/22/16 0858    Subjective Had a good day with pain yesterday and doesn't really have pain unless she is on it. Reports that she wears aircast at home. Reports that she was having trouble with R ankle DF/PF but did not do exercises prior to treatment.   Pertinent History H/o right low back pain with sciatica.     Patient Stated Goals Walk and stand without right foot pain.   Currently in Pain? Yes   Pain Score 3    Pain Location Ankle   Pain Orientation Right;Medial   Pain Type Chronic pain   Pain Onset More than a month ago            [facility] - 10/22/16 0001      Assessment   Medical Diagnosis Right tibilais post tendonitis.     Restrictions   Weight Bearing Restrictions No                     [facility] Adult PT Treatment/Exercise - 10/22/16 0001      Modalities   Modalities Electrical Stimulation;Moist  Heat;Ultrasound;Iontophoresis     Moist Heat Therapy   Number Minutes Moist Heat 15 Minutes   Moist Heat Location Ankle     Electrical Stimulation   Electrical Stimulation Location R medial ankle/ Posterior Tibialis    Electrical Stimulation Action Pre-mod   Electrical Stimulation Parameters 80-150 hz x15 min   Electrical Stimulation Goals Pain     Ultrasound   Ultrasound Location R medial ankle/ Posterior Tibialis   Ultrasound Parameters 1.0 w/cm2, 100%, 1 mhz x10 min   Ultrasound Goals Pain     Iontophoresis   Type of Iontophoresis Dexamethasone   Location RT Tib. Post tendon   Dose 1ml 3 of 6   Time 8     Manual Therapy   Manual Therapy Soft tissue mobilization   Soft tissue mobilization STW to RT  Tib post belly and along tendon into insertion with more focus on mm belly with active release                  PT Short Term Goals - 10/01/16 1111      PT SHORT TERM GOAL #1   Title STG's=LTG's.  PT Long Term Goals - 10/22/16 0936      PT LONG TERM GOAL #1   Title Independent with a HEP.   Time 8   Period Weeks   Status Achieved     PT LONG TERM GOAL #2   Title Stand 30 minutes with pain not > 2-3/10.   Time 8   Period Weeks   Status On-going     PT LONG TERM GOAL #3   Title Walk a community distance with pain not > 3/10.   Time 8   Period Weeks   Status On-going     PT LONG TERM GOAL #4   Title Increase right ankle strength to 4+ to 5/5 to increase stability for functional tasks   Time 8   Period Weeks   Status On-going               Plan - 10/22/16 0936    Clinical Impression Statement Patient arrived to treatment with continued R medial ankle pain although improving per patient report. Patient presented with minimal tightness in medial R ankle along the arch and into muscle belly. Patient had no complaints during today's treatment of pain only minimal soreness along medial heel region. Normal modalities response noted  following removal of the modalities.     Rehab Potential Good   PT Frequency 3x / week   PT Duration 4 weeks   PT Treatment/Interventions ADLs/Self Care Home Management;Cryotherapy;Electrical Stimulation;Iontophoresis 4mg /ml Dexamethasone;Moist Heat;Ultrasound;Therapeutic activities;Therapeutic exercise;Patient/family education;Manual techniques;Dry needling;Vasopneumatic Device   PT Next Visit Plan Continue with current POC and MPT POC as symptoms dictate.   Consulted and Agree with Plan of Care Patient      Patient will benefit from skilled therapeutic intervention in order to improve the following deficits and impairments:  Pain, Decreased activity tolerance  Visit Diagnosis: Pain in right ankle and joints of right foot  Muscle weakness (generalized)     Problem List Patient Active Problem List   Diagnosis Date Noted  . Obesity (BMI 30-39.9) 08/10/2013  . SUI (stress urinary incontinence, female) 05/21/2012  . UNSPECIFIED URINARY INCONTINENCE 12/19/2009  . ALLERGIC RHINITIS CAUSE UNSPECIFIED 04/05/2009  . HYPERLIPIDEMIA 02/13/2009  . ECZEMA 02/13/2009  . OSTEOPOROSIS 02/13/2009  . Unspecified essential hypertension 01/04/2009    [name], PTA 10/22/2016, 9:55 AM  [facility] [address] [place], [place], [address] Phone: [phone]   Fax:  [phone]  Name: [name] MRN: [id number] Date of Birth: 1941/08/17

## 2016-10-24 ENCOUNTER — Encounter: Payer: Self-pay | Admitting: Physical Therapy

## 2016-10-24 ENCOUNTER — Ambulatory Visit: Payer: Medicare Other | Attending: Sports Medicine | Admitting: Physical Therapy

## 2016-10-24 DIAGNOSIS — M25571 Pain in right ankle and joints of right foot: Secondary | ICD-10-CM | POA: Diagnosis not present

## 2016-10-24 DIAGNOSIS — M6281 Muscle weakness (generalized): Secondary | ICD-10-CM | POA: Insufficient documentation

## 2016-10-24 NOTE — Therapy (Signed)
[facility] [address] [place], [place], [address] Phone: [phone]   Fax:  [phone]  Physical Therapy Treatment  Patient Details  Name: [name] MRN: [id number] Date of Birth: [birth year] Referring Provider: Rodolph BongAdam Kendall MD.  Encounter Date: 10/24/2016      PT End of Session - 10/24/16 1349    Visit Number 7   Number of Visits 12   Date for PT Re-Evaluation 11/30/16   PT Start Time 1349   PT Stop Time 1445   PT Time Calculation (min) 56 min   Activity Tolerance Patient tolerated treatment well   Behavior During Therapy [facility] for tasks assessed/performed      Past Medical History:  Diagnosis Date  . Arthritis   . Cataract   . Hypertension   . Osteoporosis     Past Surgical History:  Procedure Laterality Date  . BUNIONECTOMY    . CATARACTS    . COLONOSCOPY  06/2002  . JOINT REPLACEMENT  2009, 2010   knee replacements  . NASAL SINUS SURGERY    . VOCAL CORD POLYPS     REMOVED    There were no vitals filed for this visit.      Subjective Assessment - 10/24/16 1348    Subjective Reports that she hasn't worn her aircast much today and can tell it. Worries about using aircast too much as she thinks she may lose ROM and strength.   Pertinent History H/o right low back pain with sciatica.     Patient Stated Goals Walk and stand without right foot pain.   Currently in Pain? Yes   Pain Score 2    Pain Location Ankle   Pain Orientation Right;Medial   Pain Descriptors / Indicators Sore   Pain Type Chronic pain   Pain Onset More than a month ago            [facility] PT Assessment - 10/24/16 0001      Assessment   Medical Diagnosis Right tibilais post tendonitis.   Next MD Visit None scheduled     Restrictions   Weight Bearing Restrictions No                     [facility] Adult PT Treatment/Exercise - 10/24/16 0001      Modalities   Modalities Electrical Stimulation;Moist  Heat;Ultrasound;Iontophoresis     Moist Heat Therapy   Number Minutes Moist Heat 15 Minutes   Moist Heat Location Ankle     Electrical Stimulation   Electrical Stimulation Location R medial ankle/ Posterior Tibialis    Electrical Stimulation Action Pre-Mod   Electrical Stimulation Parameters 80-150 hz x15 min   Electrical Stimulation Goals Pain     Ultrasound   Ultrasound Location R medial ankle   Ultrasound Parameters 1.2 w/cm2, 100%, 3.3 mhz x10 min   Ultrasound Goals Pain     Iontophoresis   Type of Iontophoresis Dexamethasone   Location RT Tib. Post tendon   Dose 1ml 4 of 6   Time 8     Manual Therapy   Manual Therapy Myofascial release   Myofascial Release IASTW to R medial ankle/ Posterior Tibialis to decrease pain and adhesions                  PT Short Term Goals - 10/01/16 1111      PT SHORT TERM GOAL #1   Title STG's=LTG's.           PT Long Term  Goals - 10/22/16 0936      PT LONG TERM GOAL #1   Title Independent with a HEP.   Time 8   Period Weeks   Status Achieved     PT LONG TERM GOAL #2   Title Stand 30 minutes with pain not > 2-3/10.   Time 8   Period Weeks   Status On-going     PT LONG TERM GOAL #3   Title Walk a community distance with pain not > 3/10.   Time 8   Period Weeks   Status On-going     PT LONG TERM GOAL #4   Title Increase right ankle strength to 4+ to 5/5 to increase stability for functional tasks   Time 8   Period Weeks   Status On-going               Plan - 10/24/16 1450    Clinical Impression Statement Patient arrived to clinic with reports of R ankle soreness as she has been running errands and has not worn her aircast. Patient had no complaints of soreness or discomfort during IASTW. No petiche response noted today during IASTW to medial R ankle. Normal modalities response noted following end of each modality. Iontophoresis patch placed just posteriodistal to R medial malleolus.    Rehab Potential  Good   PT Frequency 3x / week   PT Duration 4 weeks   PT Treatment/Interventions ADLs/Self Care Home Management;Cryotherapy;Electrical Stimulation;Iontophoresis 4mg /ml Dexamethasone;Moist Heat;Ultrasound;Therapeutic activities;Therapeutic exercise;Patient/family education;Manual techniques;Dry needling;Vasopneumatic Device   PT Next Visit Plan Continue with current POC and MPT POC as symptoms dictate.   Consulted and Agree with Plan of Care Patient      Patient will benefit from skilled therapeutic intervention in order to improve the following deficits and impairments:  Pain, Decreased activity tolerance  Visit Diagnosis: Pain in right ankle and joints of right foot  Muscle weakness (generalized)     Problem List Patient Active Problem List   Diagnosis Date Noted  . Obesity (BMI 30-39.9) 08/10/2013  . SUI (stress urinary incontinence, female) 05/21/2012  . UNSPECIFIED URINARY INCONTINENCE 12/19/2009  . ALLERGIC RHINITIS CAUSE UNSPECIFIED 04/05/2009  . HYPERLIPIDEMIA 02/13/2009  . ECZEMA 02/13/2009  . OSTEOPOROSIS 02/13/2009  . Unspecified essential hypertension 01/04/2009    [name], PTA 10/24/2016, 2:58 PM  [facility] [address] [place], [place], [address] Phone: [phone]   Fax:  [phone]  Name: [name] MRN: [id number] Date of Birth: [birth year]

## 2016-10-30 ENCOUNTER — Encounter: Payer: Self-pay | Admitting: Physical Therapy

## 2016-10-30 ENCOUNTER — Ambulatory Visit: Payer: Medicare Other | Admitting: Physical Therapy

## 2016-10-30 DIAGNOSIS — M6281 Muscle weakness (generalized): Secondary | ICD-10-CM

## 2016-10-30 DIAGNOSIS — M25571 Pain in right ankle and joints of right foot: Secondary | ICD-10-CM | POA: Diagnosis not present

## 2016-10-30 NOTE — Therapy (Signed)
[facility] [address] [place], [place], [address] Phone: [phone]   Fax:  [phone]  Physical Therapy Treatment  Patient Details  Name: [name] MRN: [id number] Date of Birth: [birth year] Referring Provider: Rodolph BongAdam Kendall MD.  Encounter Date: 10/30/2016      PT End of Session - 10/30/16 1104    Visit Number 8   Number of Visits 12   Date for PT Re-Evaluation 11/30/16   PT Start Time 1031   PT Stop Time 1130   PT Time Calculation (min) 59 min   Activity Tolerance Patient tolerated treatment well   Behavior During Therapy [facility] for tasks assessed/performed      Past Medical History:  Diagnosis Date  . Arthritis   . Cataract   . Hypertension   . Osteoporosis     Past Surgical History:  Procedure Laterality Date  . BUNIONECTOMY    . CATARACTS    . COLONOSCOPY  06/2002  . JOINT REPLACEMENT  2009, 2010   knee replacements  . NASAL SINUS SURGERY    . VOCAL CORD POLYPS     REMOVED    There were no vitals filed for this visit.      Subjective Assessment - 10/30/16 1032    Subjective Patient feels improvement overall, some ongoing stiffness and aircast is putting too much pressure on ankle   Pertinent History H/o right low back pain with sciatica.     Patient Stated Goals Walk and stand without right foot pain.   Currently in Pain? Yes   Pain Score 2    Pain Location Ankle   Pain Orientation Right;Medial   Pain Descriptors / Indicators Sore   Pain Type Chronic pain   Pain Onset More than a month ago   Pain Frequency Intermittent   Aggravating Factors  walking   Pain Relieving Factors at rest                         [facility] PT Treatment/Exercise - 10/30/16 0001      Moist Heat Therapy   Number Minutes Moist Heat 15 Minutes   Moist Heat Location Ankle     Electrical Stimulation   Electrical Stimulation Location R medial ankle/ Posterior Tibialis    Electrical Stimulation Action  premod   Electrical Stimulation Parameters 80-150hz  x7415min   Electrical Stimulation Goals Pain     Ultrasound   Ultrasound Location right med ankle   Ultrasound Parameters 1.2w/cm2/50%/3.423mhzx10min     Iontophoresis   Type of Iontophoresis Dexamethasone   Location RT Tib. Post tendon   Dose 1ml 5 of 6   Time 8     Manual Therapy   Manual Therapy Myofascial release   Soft tissue mobilization STW to RT  Tib post belly and along tendon into insertion    Myofascial Release                    PT Short Term Goals - 10/01/16 1111      PT SHORT TERM GOAL #1   Title STG's=LTG's.           PT Long Term Goals - 10/22/16 0936      PT LONG TERM GOAL #1   Title Independent with a HEP.   Time 8   Period Weeks   Status Achieved     PT LONG TERM GOAL #2   Title Stand 30 minutes with pain not > 2-3/10.  Time 8   Period Weeks   Status On-going     PT LONG TERM GOAL #3   Title Walk a community distance with pain not > 3/10.   Time 8   Period Weeks   Status On-going     PT LONG TERM GOAL #4   Title Increase right ankle strength to 4+ to 5/5 to increase stability for functional tasks   Time 8   Period Weeks   Status On-going               Plan - 10/30/16 1105    Clinical Impression Statement Patient tolerated treatment well today. Patient has reported 50% improvement overall. Patient has no more than 3/10 pain. Patient has noticed pain on lateral side of ankle due to aircast and will stop wearning it to see if the pain subsides. Patient has increased pain with prolong standing or walking. Patient current golas progresing yet ongoing due to pain deficts.    Rehab Potential Good   PT Frequency 3x / week   PT Duration 4 weeks   PT Treatment/Interventions ADLs/Self Care Home Management;Cryotherapy;Electrical Stimulation;Iontophoresis 4mg /ml Dexamethasone;Moist Heat;Ultrasound;Therapeutic activities;Therapeutic exercise;Patient/family education;Manual  techniques;Dry needling;Vasopneumatic Device   PT Next Visit Plan Continue with current POC and MPT POC    Consulted and Agree with Plan of Care Patient      Patient will benefit from skilled therapeutic intervention in order to improve the following deficits and impairments:  Pain, Decreased activity tolerance  Visit Diagnosis: Pain in right ankle and joints of right foot  Muscle weakness (generalized)     Problem List Patient Active Problem List   Diagnosis Date Noted  . Obesity (BMI 30-39.9) 08/10/2013  . SUI (stress urinary incontinence, female) 05/21/2012  . UNSPECIFIED URINARY INCONTINENCE 12/19/2009  . ALLERGIC RHINITIS CAUSE UNSPECIFIED 04/05/2009  . HYPERLIPIDEMIA 02/13/2009  . ECZEMA 02/13/2009  . OSTEOPOROSIS 02/13/2009  . Unspecified essential hypertension 01/04/2009    DUNFORD, CHRISTINA P, PTA 10/30/2016, 11:30 AM  [facility] [address] [place], [place], [address] Phone: [phone]   Fax:  [phone]  Name: [name] MRN: [id number] Date of Birth: [birth year]

## 2016-11-01 ENCOUNTER — Encounter: Payer: Self-pay | Admitting: Physical Therapy

## 2016-11-01 ENCOUNTER — Ambulatory Visit: Payer: Medicare Other | Admitting: Physical Therapy

## 2016-11-01 DIAGNOSIS — M25571 Pain in right ankle and joints of right foot: Secondary | ICD-10-CM | POA: Diagnosis not present

## 2016-11-01 DIAGNOSIS — M6281 Muscle weakness (generalized): Secondary | ICD-10-CM

## 2016-11-01 NOTE — Therapy (Signed)
[facility] [address] [place], [place], [address] Phone: [phone]   Fax:  [phone]  Physical Therapy Treatment  Patient Details  Name: Andrea EveryWilodae D Yetman MRN: [id number] Date of Birth: [birth year] Referring Provider: Rodolph BongAdam Kendall MD.  Encounter Date: 11/01/2016      PT End of Session - 11/01/16 1111    Visit Number 9   Number of Visits 12   Date for PT Re-Evaluation 11/30/16   PT Start Time 1036   PT Stop Time 1125   PT Time Calculation (min) 49 min   Activity Tolerance Patient tolerated treatment well   Behavior [facility] Therapy [facility] for tasks assessed/performed      Past Medical History:  Diagnosis Date  . Arthritis   . Cataract   . Hypertension   . Osteoporosis     Past Surgical History:  Procedure Laterality Date  . BUNIONECTOMY    . CATARACTS    . COLONOSCOPY  06/2002  . JOINT REPLACEMENT  2009, 2010   knee replacements  . NASAL SINUS SURGERY    . VOCAL CORD POLYPS     REMOVED    There were no vitals filed for this visit.      Subjective Assessment - 11/01/16 1104    Subjective Reports that she has some R ankle stiffness this morning but not untolerable. Reports that she has not worn her boot since Wednesday as she has felt rubbing along lateral edge of foot.   Pertinent History H/o right low back pain with sciatica.     Patient Stated Goals Walk and stand without right foot pain.   Currently in Pain? Yes   Pain Score 3    Pain Location Ankle   Pain Orientation Right;Medial   Pain Descriptors / Indicators Sore   Pain Type Chronic pain   Pain Onset More than a month ago   Pain Frequency Intermittent   Aggravating Factors  Walking   Pain Relieving Factors Resting            [facility] PT Assessment - 11/01/16 0001      Assessment   Medical Diagnosis Right tibilais post tendonitis.   Next MD Visit None scheduled     Restrictions   Weight Bearing Restrictions No                      [facility] Adult PT Treatment/Exercise - 11/01/16 0001      Modalities   Modalities Electrical Stimulation;Ultrasound;Iontophoresis;Cryotherapy     Cryotherapy   Number Minutes Cryotherapy 15 Minutes   Cryotherapy Location Ankle   Type of Cryotherapy Ice pack     Electrical Stimulation   Electrical Stimulation Location R medial ankle/ Posterior Tibialis    Electrical Stimulation Action Pre-Mod   Electrical Stimulation Parameters 80-150 hz x15 min   Electrical Stimulation Goals Pain;Edema     Ultrasound   Ultrasound Location R medial ankle   Ultrasound Parameters 1.2 w/cm2, 100%, 1 mhz x10 min   Ultrasound Goals Pain     Iontophoresis   Type of Iontophoresis Dexamethasone   Location RT Tib. Post tendon   Dose 1ml 6 of 6   Time 8     Manual Therapy   Manual Therapy Myofascial release   Myofascial Release IASTW to R medial ankle, Posterior Tibialis muscle belly, gastroc to reduce pain and adhesions                  PT Short Term Goals - 10/01/16 1111  PT SHORT TERM GOAL #1   Title STG's=LTG's.           PT Long Term Goals - 10/22/16 0936      PT LONG TERM GOAL #1   Title Independent with a HEP.   Time 8   Period Weeks   Status Achieved     PT LONG TERM GOAL #2   Title Stand 30 minutes with pain not > 2-3/10.   Time 8   Period Weeks   Status On-going     PT LONG TERM GOAL #3   Title Walk a community distance with pain not > 3/10.   Time 8   Period Weeks   Status On-going     PT LONG TERM GOAL #4   Title Increase right ankle strength to 4+ to 5/5 to increase stability for functional tasks   Time 8   Period Weeks   Status On-going               Plan - 11/01/16 1112    Clinical Impression Statement Patient tolerated treatment well with only reports of soreness prior to treatment. Patient had no reports of soreness during IASTW to R medial ankle and Posterior Tibialis muscle belly with minimal redness appearing along muscle belly.  Patient continues to experience difficulty in R ankle ROM and flexibility at this time per patient report. Normal modalities response noted following removal of the modalities. Patient's last iontophoresis patch placed over R medial ankle where pain present.    Rehab Potential Good   PT Frequency 3x / week   PT Duration 4 weeks   PT Treatment/Interventions ADLs/Self Care Home Management;Cryotherapy;Electrical Stimulation;Iontophoresis 4mg /ml Dexamethasone;Moist Heat;Ultrasound;Therapeutic activities;Therapeutic exercise;Patient/family education;Manual techniques;Dry needling;Vasopneumatic Device   PT Next Visit Plan Continue with current POC and MPT POC. Initiate rockerboard and ankle flexibility and stretching next treatment.   Consulted and Agree with Plan of Care Patient      Patient will benefit from skilled therapeutic intervention in order to improve the following deficits and impairments:  Pain, Decreased activity tolerance  Visit Diagnosis: Pain in right ankle and joints of right foot  Muscle weakness (generalized)     Problem List Patient Active Problem List   Diagnosis Date Noted  . Obesity (BMI 30-39.9) 08/10/2013  . SUI (stress urinary incontinence, female) 05/21/2012  . UNSPECIFIED URINARY INCONTINENCE 12/19/2009  . ALLERGIC RHINITIS CAUSE UNSPECIFIED 04/05/2009  . HYPERLIPIDEMIA 02/13/2009  . ECZEMA 02/13/2009  . OSTEOPOROSIS 02/13/2009  . Unspecified essential hypertension 01/04/2009    [name], PTA 11/01/2016, 12:09 PM  [facility] [address] [place], [place], [address] Phone: [phone]   Fax:  [phone]  Name: ENNA WARWICK MRN: [id number] Date of Birth: June 27, 1941

## 2016-11-06 ENCOUNTER — Ambulatory Visit: Payer: Medicare Other | Admitting: Physical Therapy

## 2016-11-06 DIAGNOSIS — M6281 Muscle weakness (generalized): Secondary | ICD-10-CM

## 2016-11-06 DIAGNOSIS — M25571 Pain in right ankle and joints of right foot: Secondary | ICD-10-CM | POA: Diagnosis not present

## 2016-11-06 NOTE — Therapy (Signed)
[facility] [address] [place], [place], [address] Phone: [phone]   Fax:  [phone]  Physical Therapy Treatment  Patient Details  Name: [name] MRN: [id number] Date of Birth: 08-Nov-1940 Referring Provider: Rodolph Bong MD.  Encounter Date: 11/06/2016      PT End of Session - 11/06/16 1624    Visit Number 10   Number of Visits 12   Date for PT Re-Evaluation 11/30/16   PT Start Time 0315   PT Stop Time 0409   PT Time Calculation (min) 54 min   Activity Tolerance Patient tolerated treatment well   Behavior During Therapy [facility] for tasks assessed/performed      Past Medical History:  Diagnosis Date  . Arthritis   . Cataract   . Hypertension   . Osteoporosis     Past Surgical History:  Procedure Laterality Date  . BUNIONECTOMY    . CATARACTS    . COLONOSCOPY  06/2002  . JOINT REPLACEMENT  2009, 2010   knee replacements  . NASAL SINUS SURGERY    . VOCAL CORD POLYPS     REMOVED    There were no vitals filed for this visit.      Subjective Assessment - 11/06/16 1625    Subjective I am doing much, much better.  I also have new inserts which seem to be helping a lot.   Patient Stated Goals Walk and stand without right foot pain.   Pain Score 2    Pain Location Ankle   Pain Orientation Right;Medial   Pain Descriptors / Indicators Sore   Pain Onset More than a month ago                         [facility] Adult PT Treatment/Exercise - 11/06/16 0001      Modalities   Modalities Electrical Stimulation;Moist Heat     Moist Heat Therapy   Number Minutes Moist Heat 20 Minutes   Moist Heat Location --  Right ankle.     Programme researcher, broadcasting/film/video Location RT POST/ANT tib   Electrical Stimulation Action Constant pre-mod   Electrical Stimulation Parameters 80-150 Hz x 20 minutes.   Electrical Stimulation Goals Pain     Ultrasound   Ultrasound Location --  RT ant/post  tib.   Ultrasound Parameters 1.50 W/CM2 at 50% combined with e'stim x 8 minutes.   Ultrasound Goals Pain     Iontophoresis   Type of Iontophoresis Dexamethasone   Location --  RT ANKLE.   Dose 1 ml     Manual Therapy   Soft tissue mobilization IASTM x 9 minutes to affected right ankle.                  PT Short Term Goals - 10/01/16 1111      PT SHORT TERM GOAL #1   Title STG's=LTG's.           PT Long Term Goals - 10/22/16 0936      PT LONG TERM GOAL #1   Title Independent with a HEP.   Time 8   Period Weeks   Status Achieved     PT LONG TERM GOAL #2   Title Stand 30 minutes with pain not > 2-3/10.   Time 8   Period Weeks   Status On-going     PT LONG TERM GOAL #3   Title Walk a community distance with pain not > 3/10.  Time 8   Period Weeks   Status On-going     PT LONG TERM GOAL #4   Title Increase right ankle strength to 4+ to 5/5 to increase stability for functional tasks   Time 8   Period Weeks   Status On-going             Patient will benefit from skilled therapeutic intervention in order to improve the following deficits and impairments:  Pain, Decreased activity tolerance  Visit Diagnosis: Pain in right ankle and joints of right foot  Muscle weakness (generalized)       G-Codes - 11/06/16 1615    Functional Assessment Tool Used FOTO....33% limitation....10th visit.   Functional Limitation Mobility: Walking and moving around   Mobility: Walking and Moving Around Current Status [phone](G8978) At least 20 percent but less than 40 percent impaired, limited or restricted   Mobility: Walking and Moving Around Goal Status [phone](G8979) At least 1 percent but less than 20 percent impaired, limited or restricted      Problem List Patient Active Problem List   Diagnosis Date Noted  . Obesity (BMI 30-39.9) 08/10/2013  . SUI (stress urinary incontinence, female) 05/21/2012  . UNSPECIFIED URINARY INCONTINENCE 12/19/2009  . ALLERGIC  RHINITIS CAUSE UNSPECIFIED 04/05/2009  . HYPERLIPIDEMIA 02/13/2009  . ECZEMA 02/13/2009  . OSTEOPOROSIS 02/13/2009  . Unspecified essential hypertension 01/04/2009    Zakaria Fromer, [place] 11/06/2016, 4:34 PM  [facility] [address] [place], [place], [address] Phone: [phone]   Fax:  [phone]  Name: Jene EveryWilodae D Kassebaum MRN: [id number] Date of Birth: 04-Sep-1941

## 2016-11-11 ENCOUNTER — Ambulatory Visit: Payer: Medicare Other | Admitting: Physical Therapy

## 2016-11-11 DIAGNOSIS — M25571 Pain in right ankle and joints of right foot: Secondary | ICD-10-CM | POA: Diagnosis not present

## 2016-11-11 DIAGNOSIS — M6281 Muscle weakness (generalized): Secondary | ICD-10-CM

## 2016-11-11 NOTE — Therapy (Signed)
[facility] [address] [place], [place], [address] Phone: [phone]   Fax:  [phone]  Physical Therapy Treatment  Patient Details  Name: [name] MRN: [id number] Date of Birth: 05-27-1941 Referring Provider: Rodolph Bong MD.  Encounter Date: 11/11/2016      PT End of Session - 11/11/16 1222    Visit Number 11   Number of Visits 12   Date for PT Re-Evaluation 11/30/16   PT Start Time 1031   PT Stop Time 1124   PT Time Calculation (min) 53 min   Activity Tolerance Patient tolerated treatment well   Behavior During Therapy [facility] for tasks assessed/performed      Past Medical History:  Diagnosis Date  . Arthritis   . Cataract   . Hypertension   . Osteoporosis     Past Surgical History:  Procedure Laterality Date  . BUNIONECTOMY    . CATARACTS    . COLONOSCOPY  06/2002  . JOINT REPLACEMENT  2009, 2010   knee replacements  . NASAL SINUS SURGERY    . VOCAL CORD POLYPS     REMOVED    There were no vitals filed for this visit.      Subjective Assessment - 11/11/16 1227    Subjective Very little pain today.   Pain Score 1    Pain Location Ankle   Pain Orientation Right;Medial   Pain Descriptors / Indicators Sore   Pain Type Chronic pain   Pain Onset More than a month ago   Pain Frequency Intermittent     Treatment:  STW/M to left post tib and calf for TP release technique x 23 minutes f/b constant pre-mod e'stim at 80-150 Hz x 20 minutes to right tib post.  Patient tolerated treatment well.                              PT Short Term Goals - 10/01/16 1111      PT SHORT TERM GOAL #1   Title STG's=LTG's.           PT Long Term Goals - 10/22/16 0936      PT LONG TERM GOAL #1   Title Independent with a HEP.   Time 8   Period Weeks   Status Achieved     PT LONG TERM GOAL #2   Title Stand 30 minutes with pain not > 2-3/10.   Time 8   Period Weeks   Status On-going      PT LONG TERM GOAL #3   Title Walk a community distance with pain not > 3/10.   Time 8   Period Weeks   Status On-going     PT LONG TERM GOAL #4   Title Increase right ankle strength to 4+ to 5/5 to increase stability for functional tasks   Time 8   Period Weeks   Status On-going               Plan - 11/11/16 1222    Clinical Impression Statement Patient making excellent progress.  CC today is localized palpable pain just post to her right medial malleolus at tib post.  Patient very pleased with her overall progress.   PT Treatment/Interventions ADLs/Self Care Home Management;Cryotherapy;Electrical Stimulation;Iontophoresis 4mg /ml Dexamethasone;Moist Heat;Ultrasound;Therapeutic activities;Therapeutic exercise;Patient/family education;Manual techniques;Dry needling;Vasopneumatic Device      Patient will benefit from skilled therapeutic intervention in order to improve the following deficits and impairments:  Pain, Decreased  activity tolerance  Visit Diagnosis: Pain in right ankle and joints of right foot  Muscle weakness (generalized)     Problem List Patient Active Problem List   Diagnosis Date Noted  . Obesity (BMI 30-39.9) 08/10/2013  . SUI (stress urinary incontinence, female) 05/21/2012  . UNSPECIFIED URINARY INCONTINENCE 12/19/2009  . ALLERGIC RHINITIS CAUSE UNSPECIFIED 04/05/2009  . HYPERLIPIDEMIA 02/13/2009  . ECZEMA 02/13/2009  . OSTEOPOROSIS 02/13/2009  . Unspecified essential hypertension 01/04/2009    APPLEGATE, [place] 11/11/2016, 12:31 PM  [facility] [address] [place], [place], [address] Phone: [phone]   Fax:  [phone]  Name: [name] MRN: [id number] Date of Birth: 1941/07/03

## 2016-11-14 ENCOUNTER — Ambulatory Visit: Payer: Medicare Other | Admitting: *Deleted

## 2016-11-14 DIAGNOSIS — M25571 Pain in right ankle and joints of right foot: Secondary | ICD-10-CM

## 2016-11-14 DIAGNOSIS — M6281 Muscle weakness (generalized): Secondary | ICD-10-CM

## 2016-11-14 NOTE — Therapy (Signed)
[facility] [place], [place], [address] Phone: [phone]   Fax:  [phone]  Physical Therapy Treatment  Patient Details  Name: [name] MRN: [id number] Date of Birth: 15-Aug-1941 Referring Provider: Vickki Hearing MD.  Encounter Date: 11/14/2016      PT End of Session - 11/14/16 1337    Visit Number 12   Number of Visits 12   Date for PT Re-Evaluation 11/30/16   PT Start Time 6962   PT Stop Time 1440   PT Time Calculation (min) 55 min      Past Medical History:  Diagnosis Date  . Arthritis   . Cataract   . Hypertension   . Osteoporosis     Past Surgical History:  Procedure Laterality Date  . BUNIONECTOMY    . CATARACTS    . COLONOSCOPY  06/2002  . JOINT REPLACEMENT  2009, 2010   knee replacements  . NASAL SINUS SURGERY    . VOCAL CORD POLYPS     REMOVED    There were no vitals filed for this visit.      Subjective Assessment - 11/14/16 1347    Subjective Not doing very well today and would like to get more visits   Pertinent History H/o right low back pain with sciatica.     Patient Stated Goals Walk and stand without right foot pain.   Currently in Pain? Yes   Pain Score 2    Pain Location Foot   Pain Orientation Right;Medial   Pain Descriptors / Indicators Sore   Pain Type Chronic pain   Pain Onset More than a month ago   Pain Frequency Intermittent                         [facility] Adult PT Treatment/Exercise - 11/14/16 0001      Modalities   Modalities Electrical Stimulation;Moist Heat     Moist Heat Therapy   Number Minutes Moist Heat 20 Minutes   Moist Heat Location Ankle     Electrical Stimulation   Electrical Stimulation Location RT POST/ANT tib premod 80-_0  x 20 mins   Electrical Stimulation Goals Pain     Ultrasound   Ultrasound Location RT   Ultrasound Parameters 1.5 w/cm2 x 10 mins   Ultrasound Goals Pain     Manual Therapy   Manual Therapy  Myofascial release   Myofascial Release IASTW to R medial ankle, Posterior Tibialis muscle belly, gastroc to reduce pain and adhesions                  PT Short Term Goals - 10/01/16 1111      PT SHORT TERM GOAL #1   Title STG's=LTG's.           PT Long Term Goals - 11/14/16 1418      PT LONG TERM GOAL #1   Title Independent with a HEP.   Time 8   Period Weeks   Status Achieved     PT LONG TERM GOAL #2   Title Stand 30 minutes with pain not > 2-3/10.   Time 8   Period Weeks   Status Achieved     PT LONG TERM GOAL #3   Title Walk a community distance with pain not > 3/10.   Time 8   Period Weeks   Status On-going     PT LONG TERM GOAL #4   Title Increase right ankle strength to 4+  to 5/5 to increase stability for functional tasks   Time 8   Period Weeks   Status On-going               Plan - 11/14/16 1350    Clinical Impression Statement Pt arrived to clinic today with some increased soreness and pain in RT foot medial arch  along Tib. Post. tendon. Pt is very pleased with her current progress and has met some LTGs, but would like to continiue with PT to get where she can walk community distances with less pain.   Rehab Potential Good   PT Frequency 3x / week   PT Duration 4 weeks   PT Treatment/Interventions ADLs/Self Care Home Management;Cryotherapy;Electrical Stimulation;Iontophoresis 63m/ml Dexamethasone;Moist Heat;Ultrasound;Therapeutic activities;Therapeutic exercise;Patient/family education;Manual techniques;Dry needling;Vasopneumatic Device   PT Next Visit Plan Continue with current POC and MPT POC. Initiate rockerboard and ankle flexibility and stretching next treatment.   Consulted and Agree with Plan of Care Patient      Patient will benefit from skilled therapeutic intervention in order to improve the following deficits and impairments:  Pain, Decreased activity tolerance  Visit Diagnosis: Pain in right ankle and joints of right  foot  Muscle weakness (generalized)     Problem List Patient Active Problem List   Diagnosis Date Noted  . Obesity (BMI 30-39.9) 08/10/2013  . SUI (stress urinary incontinence, female) 05/21/2012  . UNSPECIFIED URINARY INCONTINENCE 12/19/2009  . ALLERGIC RHINITIS CAUSE UNSPECIFIED 04/05/2009  . HYPERLIPIDEMIA 02/13/2009  . ECZEMA 02/13/2009  . OSTEOPOROSIS 02/13/2009  . Unspecified essential hypertension 01/04/2009    Johnathon Mittal,CHRIS, PTA 11/14/2016, 3:00 PM  [facility] [address]Phone: [phone]  Fax:  [phone] Name: WJAHNI PAULMRN: [id number] of Birth: 122-Feb-1942

## 2016-11-19 ENCOUNTER — Ambulatory Visit: Payer: Medicare Other | Admitting: Physical Therapy

## 2016-11-19 DIAGNOSIS — M6281 Muscle weakness (generalized): Secondary | ICD-10-CM

## 2016-11-19 DIAGNOSIS — M25571 Pain in right ankle and joints of right foot: Secondary | ICD-10-CM

## 2016-11-19 NOTE — Therapy (Signed)
[facility] [address] [place], [place], [address] Phone: [phone]   Fax:  [phone]  Physical Therapy Treatment  Patient Details  Name: [name] MRN: [id number] Date of Birth: [birth year] Referring Provider: Rodolph Bong MD.  Encounter Date: 11/19/2016      PT End of Session - 11/19/16 1623    Visit Number 13   Number of Visits 18   Date for PT Re-Evaluation 12/28/16   PT Start Time 0239   PT Stop Time 0322   PT Time Calculation (min) 43 min   Activity Tolerance Patient tolerated treatment well   Behavior During Therapy [facility] for tasks assessed/performed      Past Medical History:  Diagnosis Date  . Arthritis   . Cataract   . Hypertension   . Osteoporosis     Past Surgical History:  Procedure Laterality Date  . BUNIONECTOMY    . CATARACTS    . COLONOSCOPY  06/2002  . JOINT REPLACEMENT  2009, 2010   knee replacements  . NASAL SINUS SURGERY    . VOCAL CORD POLYPS     REMOVED    There were no vitals filed for this visit.      Subjective Assessment - 11/19/16 1624    Subjective I'm doing well today but yesterday my ankle hurt a lot.   Patient Stated Goals Walk and stand without right foot pain.   Pain Score 3    Pain Location Foot   Pain Orientation Right;Medial   Pain Descriptors / Indicators Sore   Pain Type Chronic pain   Pain Onset More than a month ago    Treatment:  HMP and e'stim to left distal tib post x 12 minutes f/b STW/M x 23 minutes.  Patient felt better after treatment.                               PT Short Term Goals - 10/01/16 1111      PT SHORT TERM GOAL #1   Title STG's=LTG's.           PT Long Term Goals - 11/14/16 1418      PT LONG TERM GOAL #1   Title Independent with a HEP.   Time 8   Period Weeks   Status Achieved     PT LONG TERM GOAL #2   Title Stand 30 minutes with pain not > 2-3/10.   Time 8   Period Weeks   Status Achieved      PT LONG TERM GOAL #3   Title Walk a community distance with pain not > 3/10.   Time 8   Period Weeks   Status On-going     PT LONG TERM GOAL #4   Title Increase right ankle strength to 4+ to 5/5 to increase stability for functional tasks   Time 8   Period Weeks   Status On-going             Patient will benefit from skilled therapeutic intervention in order to improve the following deficits and impairments:  Pain, Decreased activity tolerance  Visit Diagnosis: Pain in right ankle and joints of right foot  Muscle weakness (generalized)     Problem List Patient Active Problem List   Diagnosis Date Noted  . Obesity (BMI 30-39.9) 08/10/2013  . SUI (stress urinary incontinence, female) 05/21/2012  . UNSPECIFIED URINARY INCONTINENCE 12/19/2009  . ALLERGIC RHINITIS CAUSE UNSPECIFIED 04/05/2009  .  HYPERLIPIDEMIA 02/13/2009  . ECZEMA 02/13/2009  . OSTEOPOROSIS 02/13/2009  . Unspecified essential hypertension 01/04/2009    Shivali Quackenbush, [place] MPT 11/19/2016, 4:28 PM  [facility] [address] [place], [place], [address] Phone: [phone]   Fax:  [phone]  Name: [name] MRN: [id number] Date of Birth: [birth year]

## 2016-11-21 ENCOUNTER — Ambulatory Visit: Payer: Medicare Other | Attending: Sports Medicine | Admitting: *Deleted

## 2016-11-21 DIAGNOSIS — M25571 Pain in right ankle and joints of right foot: Secondary | ICD-10-CM

## 2016-11-21 DIAGNOSIS — M6281 Muscle weakness (generalized): Secondary | ICD-10-CM | POA: Diagnosis present

## 2016-11-21 NOTE — Therapy (Signed)
[facility] [address] [place], [place], [address] Phone: [phone]   Fax:  [phone]  Physical Therapy Treatment  Patient Details  Name: [name] MRN: [id number] Date of Birth: 03/17/1941 Referring Provider: Rodolph BongAdam Kendall MD.  Encounter Date: 11/21/2016      PT End of Session - 11/21/16 1532    Visit Number 14   Number of Visits 18   Date for PT Re-Evaluation 12/28/16   PT Start Time 1345   PT Stop Time 1435   PT Time Calculation (min) 50 min      Past Medical History:  Diagnosis Date  . Arthritis   . Cataract   . Hypertension   . Osteoporosis     Past Surgical History:  Procedure Laterality Date  . BUNIONECTOMY    . CATARACTS    . COLONOSCOPY  06/2002  . JOINT REPLACEMENT  2009, 2010   knee replacements  . NASAL SINUS SURGERY    . VOCAL CORD POLYPS     REMOVED    There were no vitals filed for this visit.      Subjective Assessment - 11/21/16 1431    Subjective My ankle is sore today, but 50-60% better overall   Pertinent History H/o right low back pain with sciatica.     Patient Stated Goals Walk and stand without right foot pain.   Currently in Pain? Yes   Pain Score 3    Pain Location Foot   Pain Orientation Right;Medial   Pain Descriptors / Indicators Sore   Pain Type Chronic pain   Pain Onset More than a month ago                         [facility] Adult PT Treatment/Exercise - 11/21/16 0001      Modalities   Modalities Electrical Stimulation;Moist Heat     Moist Heat Therapy   Number Minutes Moist Heat 15 Minutes   Moist Heat Location Ankle     Electrical Stimulation   Electrical Stimulation Location RT POST/ANT tib premod 80-150hz  x 20 mins   Electrical Stimulation Goals Pain     Ultrasound   Ultrasound Location RT tib. post tendon   Ultrasound Parameters 1.5 w/cm2 x 12 mins   Ultrasound Goals Pain     Manual Therapy   Manual Therapy Myofascial release    Myofascial Release IASTW to R medial ankle, Posterior Tibialis muscle belly, gastroc to reduce pain and adhesions                  PT Short Term Goals - 10/01/16 1111      PT SHORT TERM GOAL #1   Title STG's=LTG's.           PT Long Term Goals - 11/14/16 1418      PT LONG TERM GOAL #1   Title Independent with a HEP.   Time 8   Period Weeks   Status Achieved     PT LONG TERM GOAL #2   Title Stand 30 minutes with pain not > 2-3/10.   Time 8   Period Weeks   Status Achieved     PT LONG TERM GOAL #3   Title Walk a community distance with pain not > 3/10.   Time 8   Period Weeks   Status On-going     PT LONG TERM GOAL #4   Title Increase right ankle strength to 4+ to 5/5 to increase stability  for functional tasks   Time 8   Period Weeks   Status On-going               Plan - 11/21/16 1534    Clinical Impression Statement Pt arrived to clinic today with RT foot doing fairly well and feels that she is 50-60% better overall and can stand longer now. Her CC is that it does still hurt and the pain has not gone away totally.. She is unable to walk community distances yet without pain.   Rehab Potential Good   PT Frequency 3x / week   PT Duration 4 weeks   PT Treatment/Interventions ADLs/Self Care Home Management;Cryotherapy;Electrical Stimulation;Iontophoresis 4mg /ml Dexamethasone;Moist Heat;Ultrasound;Therapeutic activities;Therapeutic exercise;Patient/family education;Manual techniques;Dry needling;Vasopneumatic Device   PT Next Visit Plan Continue with current POC and MPT POC. Initiate rockerboard and ankle flexibility and stretching next treatment.   Consulted and Agree with Plan of Care Patient      Patient will benefit from skilled therapeutic intervention in order to improve the following deficits and impairments:  Pain, Decreased activity tolerance  Visit Diagnosis: Pain in right ankle and joints of right foot  Muscle weakness  (generalized)     Problem List Patient Active Problem List   Diagnosis Date Noted  . Obesity (BMI 30-39.9) 08/10/2013  . SUI (stress urinary incontinence, female) 05/21/2012  . UNSPECIFIED URINARY INCONTINENCE 12/19/2009  . ALLERGIC RHINITIS CAUSE UNSPECIFIED 04/05/2009  . HYPERLIPIDEMIA 02/13/2009  . ECZEMA 02/13/2009  . OSTEOPOROSIS 02/13/2009  . Unspecified essential hypertension 01/04/2009    Jory [name],Andrea, PTA 11/21/2016, 3:37 PM  [facility] [address] [place], [place], [address] Phone: [phone]   Fax:  [phone]  Name: [name] MRN: [id number] Date of Birth: 16-Oct-1940

## 2016-11-26 ENCOUNTER — Encounter: Payer: Self-pay | Admitting: Physical Therapy

## 2016-11-26 ENCOUNTER — Ambulatory Visit: Payer: Medicare Other | Admitting: Physical Therapy

## 2016-11-26 DIAGNOSIS — M25571 Pain in right ankle and joints of right foot: Secondary | ICD-10-CM | POA: Diagnosis not present

## 2016-11-26 DIAGNOSIS — M6281 Muscle weakness (generalized): Secondary | ICD-10-CM

## 2016-11-26 NOTE — Therapy (Signed)
[facility] [place], [place], [address] Phone: [phone]   Fax:  [phone]  Physical Therapy Treatment  Patient Details  Name: [name] MRN: [id number] Date of Birth: 1940/10/17 Referring Provider: Vickki Hearing MD.  Encounter Date: 11/26/2016      PT End of Session - 11/26/16 1250    Visit Number 15   Number of Visits 18   Date for PT Re-Evaluation 12/28/16   PT Start Time 0900   PT Stop Time 0950   PT Time Calculation (min) 50 min   Activity Tolerance Patient tolerated treatment well   Behavior During Therapy [facility] for tasks assessed/performed      Past Medical History:  Diagnosis Date  . Arthritis   . Cataract   . Hypertension   . Osteoporosis     Past Surgical History:  Procedure Laterality Date  . BUNIONECTOMY    . CATARACTS    . COLONOSCOPY  06/2002  . JOINT REPLACEMENT  2009, 2010   knee replacements  . NASAL SINUS SURGERY    . VOCAL CORD POLYPS     REMOVED    There were no vitals filed for this visit.      Subjective Assessment - 11/26/16 0946    Subjective Pt arriving to thearpy reporting 2/10 pain in her right ankle. Pt feels that since beginning therapy she is doing much better. Pt reporting at least 60-70% better since starting and getting her orthotics.    Pertinent History H/o right low back pain with sciatica.     Patient Stated Goals Walk and stand without right foot pain.   Currently in Pain? Yes   Pain Score 2    Pain Location Foot   Pain Orientation Right;Medial   Pain Descriptors / Indicators Sore   Pain Type Chronic pain   Pain Onset More than a month ago   Pain Frequency Intermittent   Aggravating Factors  walking   Pain Relieving Factors resting                         [facility] Adult PT Treatment/Exercise - 11/26/16 0001      Exercises   Exercises Ankle     Modalities   Modalities Electrical Stimulation;Moist Heat     Moist Heat Therapy    Number Minutes Moist Heat 15 Minutes   Moist Heat Location Ankle     Electrical Stimulation   Electrical Stimulation Location RT POST/ANT tib premod 80-'150hz'  x 20 mins   Electrical Stimulation Action premod   Electrical Stimulation Parameters 80-150 Hz x 15 minutes   Electrical Stimulation Goals Pain     Ultrasound   Ultrasound Location Right Post tib tendon   Ultrasound Parameters 1.5 w/cm2 x 8 minutes   Ultrasound Goals Pain     Manual Therapy   Manual Therapy Myofascial release   Myofascial Release IASTW to R medial ankle, Posterior Tibialis muscle belly, gastroc to reduce pain and adhesions     Ankle Exercises: Seated   Other Seated Ankle Exercises active ROM on green disc,    Other Seated Ankle Exercises DF, Inversion/Eversion using yellow theraband                PT Education - 11/26/16 1249    Education provided Yes   Education Details HEP review, Discussed custom orthotic options   Person(s) Educated Patient   Methods Explanation   Comprehension Verbalized understanding  PT Short Term Goals - 11/26/16 1307      PT SHORT TERM GOAL #1   Title STG's=LTG's.           PT Long Term Goals - 11/26/16 1254      PT LONG TERM GOAL #1   Title Independent with a HEP.   Period Weeks   Status Achieved     PT LONG TERM GOAL #2   Title Stand 30 minutes with pain not > 2-3/10.   Time 8   Period Weeks   Status Achieved     PT LONG TERM GOAL #3   Title Walk a community distance with pain not > 3/10.   Period Weeks   Status On-going     PT LONG TERM GOAL #4   Title Increase right ankle strength to 4+ to 5/5 to increase stability for functional tasks   Time 8   Period Weeks   Status On-going               Plan - 11/26/16 1250    Clinical Impression Statement Patient arriving to clinic today with R foot pain. Pt reporting she feels like she is doing 60-70% better since beginning therapy. Pt with no new goals met. Discussed custom  orthotics and advised pt to contact ther insurance company to see if they covered the costs. Pt still reporting difficulty with amb community distances without pain and standing for long periods.    Rehab Potential Good   PT Frequency 3x / week   PT Duration 4 weeks   PT Treatment/Interventions ADLs/Self Care Home Management;Cryotherapy;Electrical Stimulation;Iontophoresis 90m/ml Dexamethasone;Moist Heat;Ultrasound;Therapeutic activities;Therapeutic exercise;Patient/family education;Manual techniques;Dry needling;Vasopneumatic Device   PT Next Visit Plan Continue with current POC and MPT POC. Initiate rockerboard and ankle flexibility and stretching next treatment, Issue pt red theraband for home use.    PT Home Exercise Plan Ankle strengthening using yellow theraband   Consulted and Agree with Plan of Care Patient      Patient will benefit from skilled therapeutic intervention in order to improve the following deficits and impairments:  Pain, Decreased activity tolerance, Difficulty walking  Visit Diagnosis: Muscle weakness (generalized)  Pain in right ankle and joints of right foot     Problem List Patient Active Problem List   Diagnosis Date Noted  . Obesity (BMI 30-39.9) 08/10/2013  . SUI (stress urinary incontinence, female) 05/21/2012  . UNSPECIFIED URINARY INCONTINENCE 12/19/2009  . ALLERGIC RHINITIS CAUSE UNSPECIFIED 04/05/2009  . HYPERLIPIDEMIA 02/13/2009  . ECZEMA 02/13/2009  . OSTEOPOROSIS 02/13/2009  . Unspecified essential hypertension 01/04/2009    JOretha Caprice MPT 11/26/2016, 1:19 PM  [facility] [address]Phone: [phone]  Fax:  [phone] Name: Andrea WEAKLANDMRN: [id number] of Birth: [birth year]

## 2016-11-28 ENCOUNTER — Ambulatory Visit: Payer: Medicare Other | Admitting: Physical Therapy

## 2016-11-28 ENCOUNTER — Encounter: Payer: Self-pay | Admitting: Physical Therapy

## 2016-11-28 DIAGNOSIS — M25571 Pain in right ankle and joints of right foot: Secondary | ICD-10-CM | POA: Diagnosis not present

## 2016-11-28 DIAGNOSIS — M6281 Muscle weakness (generalized): Secondary | ICD-10-CM

## 2016-11-28 NOTE — Therapy (Signed)
[facility] [address] [place], [place], [address] Phone: [phone]   Fax:  [phone]  Physical Therapy Treatment  Patient Details  Name: [name] MRN: [id number] Date of Birth: 26-May-1941 Referring Provider: Rodolph Bong MD.  Encounter Date: 11/28/2016      PT End of Session - 11/28/16 0907    Visit Number 16   Number of Visits 18   Date for PT Re-Evaluation 12/28/16   PT Start Time 0907   PT Stop Time 0958   PT Time Calculation (min) 51 min   Activity Tolerance Patient tolerated treatment well   Behavior During Therapy [facility] for tasks assessed/performed      Past Medical History:  Diagnosis Date  . Arthritis   . Cataract   . Hypertension   . Osteoporosis     Past Surgical History:  Procedure Laterality Date  . BUNIONECTOMY    . CATARACTS    . COLONOSCOPY  06/2002  . JOINT REPLACEMENT  2009, 2010   knee replacements  . NASAL SINUS SURGERY    . VOCAL CORD POLYPS     REMOVED    There were no vitals filed for this visit.      Subjective Assessment - 11/28/16 0905    Subjective Reports that she went shopping in [place] and then just went to [employer] prior to treatment. Hurts more in very anterior ankle region. Reports more pain with DF.   Pertinent History H/o right low back pain with sciatica.     Patient Stated Goals Walk and stand without right foot pain.   Currently in Pain? Yes   Pain Score 4    Pain Location Foot   Pain Orientation Right;Anterior   Pain Descriptors / Indicators Discomfort   Pain Type Chronic pain   Pain Onset More than a month ago            [facility] PT Assessment - 11/28/16 0001      Assessment   Medical Diagnosis Right tibilais post tendonitis.   Next MD Visit None scheduled     Restrictions   Weight Bearing Restrictions No                     [facility] Adult PT Treatment/Exercise - 11/28/16 0001      Modalities   Modalities Electrical  Stimulation;Ultrasound;Vasopneumatic     Electrical Stimulation   Electrical Stimulation Location R anterior TIbialis region   Electrical Stimulation Action Pre-Mod   Electrical Stimulation Parameters 80-150 hz x15 min   Electrical Stimulation Goals Pain;Edema     Ultrasound   Ultrasound Location R Anterior Tibialis   Ultrasound Parameters 1.2 w/cm2, 100%, 1 mhz x10 min   Ultrasound Goals Pain;Edema     Iontophoresis   Type of Iontophoresis Dexamethasone   Location RT Tib. Post tendon   Dose 1 ml   Time 8     Vasopneumatic   Number Minutes Vasopneumatic  15 minutes   Vasopnuematic Location  Ankle   Vasopneumatic Pressure Medium   Vasopneumatic Temperature  34     Manual Therapy   Manual Therapy Soft tissue mobilization   Soft tissue mobilization STW/MFR to R Anterior TIbialis region to reduce edema and pain                   PT Short Term Goals - 11/26/16 1307      PT SHORT TERM GOAL #1   Title STG's=LTG's.  PT Long Term Goals - 11/26/16 1254      PT LONG TERM GOAL #1   Title Independent with a HEP.   Period Weeks   Status Achieved     PT LONG TERM GOAL #2   Title Stand 30 minutes with pain not > 2-3/10.   Time 8   Period Weeks   Status Achieved     PT LONG TERM GOAL #3   Title Walk a community distance with pain not > 3/10.   Period Weeks   Status On-going     PT LONG TERM GOAL #4   Title Increase right ankle strength to 4+ to 5/5 to increase stability for functional tasks   Time 8   Period Weeks   Status On-going               Plan - 11/28/16 1001    Clinical Impression Statement Patient arrived to treatment with more pain along R Anterior Tibialis region of the R forefoot today with edema in the region as well. Pain was especially present with R ankle DF per patient report. Normal modalities response noted following removal of the modalities. Tone and edema stil present even following manual therapy to which patient was  encouraged to use ice pack at home. Iontophoresis patch placed over the painful R Anterior Tibialis region today as new order signed into system.    Rehab Potential Good   PT Frequency 3x / week   PT Duration 4 weeks   PT Treatment/Interventions ADLs/Self Care Home Management;Cryotherapy;Electrical Stimulation;Iontophoresis 4mg /ml Dexamethasone;Moist Heat;Ultrasound;Therapeutic activities;Therapeutic exercise;Patient/family education;Manual techniques;Dry needling;Vasopneumatic Device   PT Next Visit Plan Continue with current POC and MPT POC. Initiate rockerboard and ankle flexibility and stretching next treatment, Issue pt red theraband for home use.    PT Home Exercise Plan Ankle strengthening using yellow theraband   Consulted and Agree with Plan of Care Patient      Patient will benefit from skilled therapeutic intervention in order to improve the following deficits and impairments:  Pain, Decreased activity tolerance, Difficulty walking  Visit Diagnosis: Pain in right ankle and joints of right foot  Muscle weakness (generalized)     Problem List Patient Active Problem List   Diagnosis Date Noted  . Obesity (BMI 30-39.9) 08/10/2013  . SUI (stress urinary incontinence, female) 05/21/2012  . UNSPECIFIED URINARY INCONTINENCE 12/19/2009  . ALLERGIC RHINITIS CAUSE UNSPECIFIED 04/05/2009  . HYPERLIPIDEMIA 02/13/2009  . ECZEMA 02/13/2009  . OSTEOPOROSIS 02/13/2009  . Unspecified essential hypertension 01/04/2009    Evelene CroonKelsey M Parsons, PTA 11/28/2016, 10:04 AM  [facility] [address] [place], [place], [address] Phone: [phone]   Fax:  [phone]  Name: Jene EveryWilodae D Feigenbaum MRN: [id number] Date of Birth: December 14, 1940

## 2016-12-03 ENCOUNTER — Encounter: Payer: Medicare Other | Admitting: Physical Therapy

## 2016-12-05 ENCOUNTER — Ambulatory Visit: Payer: Medicare Other | Admitting: *Deleted

## 2016-12-05 DIAGNOSIS — M25571 Pain in right ankle and joints of right foot: Secondary | ICD-10-CM

## 2016-12-05 DIAGNOSIS — M6281 Muscle weakness (generalized): Secondary | ICD-10-CM

## 2016-12-05 NOTE — Therapy (Signed)
[facility] [address] [place], [place], [address] Phone: [phone]   Fax:  [phone]  Physical Therapy Treatment  Patient Details  Name: [name] MRN: [id number] Date of Birth: [birth year] Referring Provider: Rodolph BongAdam Kendall MD.  Encounter Date: 12/05/2016      PT End of Session - 12/05/16 0952    Visit Number 17   Number of Visits 18   Date for PT Re-Evaluation 12/28/16   PT Start Time 0903   PT Stop Time 0957   PT Time Calculation (min) 54 min      Past Medical History:  Diagnosis Date  . Arthritis   . Cataract   . Hypertension   . Osteoporosis     Past Surgical History:  Procedure Laterality Date  . BUNIONECTOMY    . CATARACTS    . COLONOSCOPY  06/2002  . JOINT REPLACEMENT  2009, 2010   knee replacements  . NASAL SINUS SURGERY    . VOCAL CORD POLYPS     REMOVED    There were no vitals filed for this visit.                       [facility] Adult PT Treatment/Exercise - 12/05/16 0001      Exercises   Exercises Ankle     Modalities   Modalities Electrical Stimulation;Ultrasound;Vasopneumatic     Moist Heat Therapy   Number Minutes Moist Heat 15 Minutes   Moist Heat Location Ankle     Electrical Stimulation   Electrical Stimulation Location RT POST/ANT tib premod 80-150hz  x 20 mins   Electrical Stimulation Goals Pain     Ultrasound   Ultrasound Location RT posterior tib   Ultrasound Parameters 1.5 w/cm2 x 10 mins   Ultrasound Goals Pain;Edema     Iontophoresis   Type of Iontophoresis Dexamethasone   Location RT Tib. Post tendon   Dose 1 ml   Time 8     Manual Therapy   Manual Therapy Soft tissue mobilization   Myofascial Release IASTW to R medial ankle, Posterior Tibialis muscle belly, gastroc to reduce pain and adhesions                  PT Short Term Goals - 11/26/16 1307      PT SHORT TERM GOAL #1   Title STG's=LTG's.           PT Long Term Goals -  11/26/16 1254      PT LONG TERM GOAL #1   Title Independent with a HEP.   Period Weeks   Status Achieved     PT LONG TERM GOAL #2   Title Stand 30 minutes with pain not > 2-3/10.   Time 8   Period Weeks   Status Achieved     PT LONG TERM GOAL #3   Title Walk a community distance with pain not > 3/10.   Period Weeks   Status On-going     PT LONG TERM GOAL #4   Title Increase right ankle strength to 4+ to 5/5 to increase stability for functional tasks   Time 8   Period Weeks   Status On-going               Plan - 12/05/16 0954    Clinical Impression Statement Pt arrived  to clinic with tenderness along TP region and with mild swelling around lateral malleolus. She has set up an appt now to be seen for  an orthotic to be made for RT foot. Normal response to modalities and Ionto patch was applied.   Rehab Potential Good   PT Frequency 3x / week   PT Duration 4 weeks   PT Treatment/Interventions ADLs/Self Care Home Management;Cryotherapy;Electrical Stimulation;Iontophoresis 4mg /ml Dexamethasone;Moist Heat;Ultrasound;Therapeutic activities;Therapeutic exercise;Patient/family education;Manual techniques;Dry needling;Vasopneumatic Device   PT Next Visit Plan Continue with current POC and MPT POC. Initiate rockerboard and ankle flexibility and stretching next treatment, Issue pt red theraband for home use.    PT Home Exercise Plan Ankle strengthening using yellow theraband   Consulted and Agree with Plan of Care Patient      Patient will benefit from skilled therapeutic intervention in order to improve the following deficits and impairments:  Pain, Decreased activity tolerance, Difficulty walking  Visit Diagnosis: Pain in right ankle and joints of right foot  Muscle weakness (generalized)     Problem List Patient Active Problem List   Diagnosis Date Noted  . Obesity (BMI 30-39.9) 08/10/2013  . SUI (stress urinary incontinence, female) 05/21/2012  . UNSPECIFIED  URINARY INCONTINENCE 12/19/2009  . ALLERGIC RHINITIS CAUSE UNSPECIFIED 04/05/2009  . HYPERLIPIDEMIA 02/13/2009  . ECZEMA 02/13/2009  . OSTEOPOROSIS 02/13/2009  . Unspecified essential hypertension 01/04/2009    Jaykub Mackins,CHRIS, PTA 12/05/2016, 6:08 PM  [facility] [address] [place], [place], [address] Phone: [phone]   Fax:  [phone]  Name: [name] MRN: [id number] Date of Birth: [birth year]

## 2016-12-09 ENCOUNTER — Ambulatory Visit: Payer: Medicare Other | Admitting: Physical Therapy

## 2016-12-09 DIAGNOSIS — M6281 Muscle weakness (generalized): Secondary | ICD-10-CM

## 2016-12-09 DIAGNOSIS — M25571 Pain in right ankle and joints of right foot: Secondary | ICD-10-CM | POA: Diagnosis not present

## 2016-12-09 NOTE — Therapy (Signed)
[facility] [address] [place], [place], [address] Phone: [phone]   Fax:  [phone]  Physical Therapy Treatment  Patient Details  Name: [name] MRN: [id number] Date of Birth: 1941-04-02 Referring Provider: Rodolph Bong MD.  Encounter Date: 12/09/2016      PT End of Session - 12/09/16 1100    Visit Number 18   Number of Visits 18   Date for PT Re-Evaluation 12/28/16   PT Start Time 0945   PT Stop Time 1030   PT Time Calculation (min) 45 min      Past Medical History:  Diagnosis Date  . Arthritis   . Cataract   . Hypertension   . Osteoporosis     Past Surgical History:  Procedure Laterality Date  . BUNIONECTOMY    . CATARACTS    . COLONOSCOPY  06/2002  . JOINT REPLACEMENT  2009, 2010   knee replacements  . NASAL SINUS SURGERY    . VOCAL CORD POLYPS     REMOVED    There were no vitals filed for this visit.      Subjective Assessment - 12/09/16 1100    Subjective I have a rash probably from that patch.  I was doing great last week but my ankle hurt and swelled a lot yesterday.   Patient Stated Goals Walk and stand without right foot pain.   Pain Score 5    Pain Location Foot   Pain Orientation Right;Anterior   Pain Descriptors / Indicators Discomfort   Pain Type Chronic pain                         [facility] Adult PT Treatment/Exercise - 12/09/16 0001      Exercises   Exercises Knee/Hip     Knee/Hip Exercises: Aerobic   Nustep Level 4 x 15 minutes.     Modalities   Modalities Doctor, general practice Location Right tib post.   Engineer, manufacturing Constant pre-mod   Electrical Stimulation Parameters 80-150 hz x 15 minutes.   Electrical Stimulation Goals Pain     Manual Therapy   Manual Therapy Soft tissue mobilization   Myofascial Release STW/M x 8 minutes to right tib post. x 8 minutes.                   PT Short Term Goals - 11/26/16 1307      PT SHORT TERM GOAL #1   Title STG's=LTG's.           PT Long Term Goals - 11/26/16 1254      PT LONG TERM GOAL #1   Title Independent with a HEP.   Period Weeks   Status Achieved     PT LONG TERM GOAL #2   Title Stand 30 minutes with pain not > 2-3/10.   Time 8   Period Weeks   Status Achieved     PT LONG TERM GOAL #3   Title Walk a community distance with pain not > 3/10.   Period Weeks   Status On-going     PT LONG TERM GOAL #4   Title Increase right ankle strength to 4+ to 5/5 to increase stability for functional tasks   Time 8   Period Weeks   Status On-going               Plan - 12/09/16 1105    Clinical  Impression Statement Patient with small red rash on right medial ankle region likely related to adhesive from Ionto patch (will d/c at this time).  patient states she was doing great last weekend but had a flare-up this weeekend.  Patient would like 6 more treatments.   PT Treatment/Interventions ADLs/Self Care Home Management;Cryotherapy;Electrical Stimulation;Iontophoresis 4mg /ml Dexamethasone;Moist Heat;Ultrasound;Therapeutic activities;Therapeutic exercise;Patient/family education;Manual techniques;Dry needling;Vasopneumatic Device      Patient will benefit from skilled therapeutic intervention in order to improve the following deficits and impairments:  Pain, Decreased activity tolerance, Difficulty walking  Visit Diagnosis: Muscle weakness (generalized)  Pain in right ankle and joints of right foot     Problem List Patient Active Problem List   Diagnosis Date Noted  . Obesity (BMI 30-39.9) 08/10/2013  . SUI (stress urinary incontinence, female) 05/21/2012  . UNSPECIFIED URINARY INCONTINENCE 12/19/2009  . ALLERGIC RHINITIS CAUSE UNSPECIFIED 04/05/2009  . HYPERLIPIDEMIA 02/13/2009  . ECZEMA 02/13/2009  . OSTEOPOROSIS 02/13/2009  . Unspecified essential hypertension 01/04/2009    Nannie Starzyk,  [place] MPT 12/09/2016, 11:23 AM  [facility] [address] [place], [place], [id number] Phone: [phone]   Fax:  [phone]  Name: [name] MRN: [id number] Date of Birth: [birth year]

## 2016-12-12 ENCOUNTER — Encounter: Payer: Self-pay | Admitting: Physical Therapy

## 2016-12-12 ENCOUNTER — Ambulatory Visit: Payer: Medicare Other | Admitting: Physical Therapy

## 2016-12-12 DIAGNOSIS — M25571 Pain in right ankle and joints of right foot: Secondary | ICD-10-CM

## 2016-12-12 DIAGNOSIS — M6281 Muscle weakness (generalized): Secondary | ICD-10-CM

## 2016-12-12 NOTE — Therapy (Addendum)
[facility] [place], [place], [address] Phone: [phone]   Fax:  [phone]  Physical Therapy Treatment  Patient Details  Name: [name] MRN: [id number] Date of Birth: [birth year] Referring Provider: Vickki Hearing MD.  Encounter Date: 12/12/2016      PT End of Session - 12/12/16 1414    Visit Number 19   Number of Visits 24   Date for PT Re-Evaluation 01/25/17   PT Start Time 0370   PT Stop Time 1444   PT Time Calculation (min) 42 min   Activity Tolerance Patient tolerated treatment well   Behavior During Therapy [facility] for tasks assessed/performed      Past Medical History:  Diagnosis Date  . Arthritis   . Cataract   . Hypertension   . Osteoporosis     Past Surgical History:  Procedure Laterality Date  . BUNIONECTOMY    . CATARACTS    . COLONOSCOPY  06/2002  . JOINT REPLACEMENT  2009, 2010   knee replacements  . NASAL SINUS SURGERY    . VOCAL CORD POLYPS     REMOVED    There were no vitals filed for this visit.      Subjective Assessment - 12/12/16 1405    Subjective Patient reported feeling great this week, and less pain    Pertinent History H/o right low back pain with sciatica.     Patient Stated Goals Walk and stand without right foot pain.   Currently in Pain? Yes   Pain Score 2    Pain Location Foot   Pain Orientation Right;Anterior   Pain Descriptors / Indicators Discomfort   Pain Type Chronic pain   Pain Onset More than a month ago   Pain Frequency Intermittent   Aggravating Factors  walking   Pain Relieving Factors at rest                         [facility] Adult PT Treatment/Exercise - 12/12/16 0001      Knee/Hip Exercises: Aerobic   Nustep Level 4 x 59mn     Moist Heat Therapy   Number Minutes Moist Heat 15 Minutes   Moist Heat Location Ankle     Electrical Stimulation   Electrical Stimulation Location Right tib post.   Electrical Stimulation Action  premod   Electrical Stimulation Parameters 80-_0  x118m   Electrical Stimulation Goals Pain     Manual Therapy   Manual Therapy Soft tissue mobilization   Myofascial Release manual and IASTM to right med ankle/post tib muscle                  PT Short Term Goals - 11/26/16 1307      PT SHORT TERM GOAL #1   Title STG's=LTG's.           PT Long Term Goals - 11/26/16 1254      PT LONG TERM GOAL #1   Title Independent with a HEP.   Period Weeks   Status Achieved     PT LONG TERM GOAL #2   Title Stand 30 minutes with pain not > 2-3/10.   Time 8   Period Weeks   Status Achieved     PT LONG TERM GOAL #3   Title Walk a community distance with pain not > 3/10.   Period Weeks   Status On-going     PT LONG TERM GOAL #4   Title Increase right ankle strength  to 4+ to 5/5 to increase stability for functional tasks   Time 8   Period Weeks   Status On-going               Plan - 12/12/16 1434    Clinical Impression Statement Patient tolerated treatment well today. Patient reported feeling less pain overall and therapy has helped. Patient has ongoing pain with prolong walking and unable to walk a community distance. Patient doing well with nustep and would like to progress strengthening slowly. Patient remaining goals ongoing due to pain and strength deficts.    Rehab Potential Good   PT Frequency 3x / week   PT Duration 4 weeks   PT Treatment/Interventions ADLs/Self Care Home Management;Cryotherapy;Electrical Stimulation;Iontophoresis 41m/ml Dexamethasone;Moist Heat;Ultrasound;Therapeutic activities;Therapeutic exercise;Patient/family education;Manual techniques;Dry needling;Vasopneumatic Device   PT Next Visit Plan cont with POC/no Ionto   Consulted and Agree with Plan of Care Patient      Patient will benefit from skilled therapeutic intervention in order to improve the following deficits and impairments:  Pain, Decreased activity tolerance, Difficulty  walking  Visit Diagnosis: Muscle weakness (generalized)  Pain in right ankle and joints of right foot     Problem List Patient Active Problem List   Diagnosis Date Noted  . Obesity (BMI 30-39.9) 08/10/2013  . SUI (stress urinary incontinence, female) 05/21/2012  . UNSPECIFIED URINARY INCONTINENCE 12/19/2009  . ALLERGIC RHINITIS CAUSE UNSPECIFIED 04/05/2009  . HYPERLIPIDEMIA 02/13/2009  . ECZEMA 02/13/2009  . OSTEOPOROSIS 02/13/2009  . Unspecified essential hypertension 01/04/2009    APPLEGATE, CMaliMPT 12/12/2016, 3:39 PM CMaliApplegate MPT [facility] [address] [address]Phone: [phone]  Fax:  [phone] Name: Andrea FINNELLMRN: [id number] of Birth: 1January 26, 1942 PHYSICAL THERAPY DISCHARGE SUMMARY  Visits from Start of Care: 19.  Current functional level related to goals / functional outcomes: See above.   Remaining deficits: Goals #3 and 4 unmet.   Education / Equipment: HEP. Plan: Patient agrees to discharge.  Patient goals were partially met. Patient is being discharged due to being pleased with the current functional level.  ?????         CMaliApplegate MPT

## 2017-03-06 ENCOUNTER — Encounter: Payer: Self-pay | Admitting: Adult Health

## 2017-03-06 ENCOUNTER — Ambulatory Visit (INDEPENDENT_AMBULATORY_CARE_PROVIDER_SITE_OTHER): Payer: Medicare Other | Admitting: Adult Health

## 2017-03-06 VITALS — BP 122/70 | Temp 97.9°F | Ht <= 58 in | Wt 162.2 lb

## 2017-03-06 DIAGNOSIS — K047 Periapical abscess without sinus: Secondary | ICD-10-CM

## 2017-03-06 MED ORDER — CLARITHROMYCIN 500 MG PO TABS: 250.0000 mg | ORAL_TABLET | Freq: Two times a day (BID) | ORAL | 0 refills | Status: AC

## 2017-03-06 NOTE — Progress Notes (Signed)
Subjective:    Patient ID: [name], female    DOB: 1941/07/01, 76 y.o.   MRN: [id number]  Dental Pain   This is a new problem. The current episode started in the past 7 days. The problem occurs constantly. The problem has been gradually worsening. The pain is at a severity of 7/10. The pain is moderate. Associated symptoms include facial pain and sinus pressure. Associated symptoms comments: Difficulty chewing  . She has tried acetaminophen and NSAIDs for the symptoms. The treatment provided no relief.      Review of Systems  Constitutional: Negative.   HENT: Positive for dental problem, facial swelling and sinus pressure.   Respiratory: Negative.   Cardiovascular: Negative.   All other systems reviewed and are negative.  Past Medical History:  Diagnosis Date  . Arthritis   . Cataract   . Hypertension   . Osteoporosis     Social History   Social History  . Marital status: Married    Spouse name: N/A  . Number of children: N/A  . Years of education: N/A   Occupational History  . Not on file.   Social History Main Topics  . Smoking status: Former Smoker    Packs/day: 0.50    Years: 3.00    Quit date: 11/08/1987  . Smokeless tobacco: Never Used  . Alcohol use No  . Drug use: No  . Sexual activity: Not on file   Other Topics Concern  . Not on file   Social History Narrative  . No narrative on file    Past Surgical History:  Procedure Laterality Date  . BUNIONECTOMY    . CATARACTS    . COLONOSCOPY  06/2002  . JOINT REPLACEMENT  2009, 2010   knee replacements  . NASAL SINUS SURGERY    . VOCAL CORD POLYPS     REMOVED    Family History  Problem Relation Age of Onset  . Colon cancer Sister 57  . Cancer Father        prostate  . Arthritis Father     Allergies  Allergen Reactions  . Alendronate Sodium     REACTION: constipation  . Celecoxib     REACTION: GI upset  . Penicillins     REACTION: serious dehydration, hospitalized in 1990     Current Outpatient Prescriptions on File Prior to Visit  Medication Sig Dispense Refill  . acetaminophen (TYLENOL) 650 MG CR tablet Take 1,300 mg by mouth [name] 8 (eight) hours as needed (For arthritis pain.).    Marland Kitchen lisinopril-hydrochlorothiazide (PRINZIDE,ZESTORETIC) 20-12.5 MG tablet Take 1 tablet by mouth at bedtime. 90 tablet 2   No current facility-administered medications on file prior to visit.     BP 122/70 (BP Location: Left Arm, Patient Position: Sitting, Cuff Size: Normal)   Temp 97.9 F (36.6 C) (Oral)   Ht 4' 8.5" (1.435 m)   Wt 162 lb 3.2 oz (73.6 kg)   BMI 35.72 kg/m       Objective:   Physical Exam  Constitutional: She appears well-developed and well-nourished. No distress.  HENT:  Head: Normocephalic and atraumatic.  Right Ear: External ear normal.  Left Ear: External ear normal.  Nose: Nose normal.  Mouth/Throat: Oropharynx is clear and moist. Dental abscesses present. No oropharyngeal exudate.    Neck: Normal range of motion. Neck supple.  Cardiovascular: Intact distal pulses.   Lymphadenopathy:    She has no cervical adenopathy.  Skin: She is not diaphoretic.  Nursing note  and vitals reviewed.     Assessment & Plan:  1. Dental abscess - Allergic to PCN. Will treat with biaxin for dental abscess. Follow up with dentist  - clarithromycin (BIAXIN) 500 MG tablet; Take 0.5 tablets (250 mg total) by mouth 2 (two) times daily.  Dispense: 10 tablet; Refill: 0  Shirline Freesory Derin Granquist, NP

## 2017-06-06 ENCOUNTER — Other Ambulatory Visit: Payer: Self-pay | Admitting: Family Medicine

## 2017-06-12 ENCOUNTER — Encounter: Payer: Self-pay | Admitting: Family Medicine

## 2017-06-16 ENCOUNTER — Telehealth: Payer: Self-pay | Admitting: Family Medicine

## 2017-06-16 NOTE — Telephone Encounter (Signed)
Pt would like to have a tetanus shot is she eligible for one? 

## 2017-06-16 NOTE — Telephone Encounter (Signed)
Okay to schedule a Td.  thanks

## 2017-06-16 NOTE — Telephone Encounter (Signed)
Pt was called and scheduled !

## 2017-06-19 ENCOUNTER — Ambulatory Visit (INDEPENDENT_AMBULATORY_CARE_PROVIDER_SITE_OTHER): Payer: Medicare Other | Admitting: *Deleted

## 2017-06-19 DIAGNOSIS — Z23 Encounter for immunization: Secondary | ICD-10-CM | POA: Diagnosis not present

## 2017-09-04 ENCOUNTER — Other Ambulatory Visit: Payer: Self-pay | Admitting: Family Medicine

## 2017-09-30 ENCOUNTER — Ambulatory Visit (INDEPENDENT_AMBULATORY_CARE_PROVIDER_SITE_OTHER): Payer: Medicare Other | Admitting: Family Medicine

## 2017-09-30 ENCOUNTER — Encounter: Payer: Self-pay | Admitting: Family Medicine

## 2017-09-30 VITALS — BP 120/68 | HR 95 | Temp 98.3°F | Ht <= 58 in | Wt 158.3 lb

## 2017-09-30 DIAGNOSIS — E785 Hyperlipidemia, unspecified: Secondary | ICD-10-CM

## 2017-09-30 DIAGNOSIS — Z0001 Encounter for general adult medical examination with abnormal findings: Secondary | ICD-10-CM | POA: Diagnosis not present

## 2017-09-30 DIAGNOSIS — R0981 Nasal congestion: Secondary | ICD-10-CM | POA: Diagnosis not present

## 2017-09-30 DIAGNOSIS — I10 Essential (primary) hypertension: Secondary | ICD-10-CM | POA: Diagnosis not present

## 2017-09-30 DIAGNOSIS — Z0000 Encounter for general adult medical examination without abnormal findings: Secondary | ICD-10-CM

## 2017-09-30 DIAGNOSIS — J019 Acute sinusitis, unspecified: Secondary | ICD-10-CM

## 2017-09-30 LAB — CBC WITH DIFFERENTIAL/PLATELET
Basophils Absolute: 0 10*3/uL (ref 0.0–0.1)
Basophils Relative: 0.6 % (ref 0.0–3.0)
Eosinophils Absolute: 0.1 10*3/uL (ref 0.0–0.7)
Eosinophils Relative: 1.7 % (ref 0.0–5.0)
HCT: 44.4 % (ref 36.0–46.0)
HEMOGLOBIN: 14.7 g/dL (ref 12.0–15.0)
LYMPHS ABS: 2.2 10*3/uL (ref 0.7–4.0)
Lymphocytes Relative: 30.4 % (ref 12.0–46.0)
MCHC: 33.2 g/dL (ref 30.0–36.0)
MCV: 97.5 fl (ref 78.0–100.0)
MONO ABS: 0.6 10*3/uL (ref 0.1–1.0)
MONOS PCT: 8.7 % (ref 3.0–12.0)
NEUTROS PCT: 58.6 % (ref 43.0–77.0)
Neutro Abs: 4.2 10*3/uL (ref 1.4–7.7)
Platelets: 265 10*3/uL (ref 150.0–400.0)
RBC: 4.56 Mil/uL (ref 3.87–5.11)
RDW: 13.7 % (ref 11.5–15.5)
WBC: 7.2 10*3/uL (ref 4.0–10.5)

## 2017-09-30 LAB — LIPID PANEL
CHOL/HDL RATIO: 4
Cholesterol: 216 mg/dL — ABNORMAL HIGH (ref 0–200)
HDL: 49 mg/dL (ref >39.00)
LDL Cholesterol: 138 mg/dL — ABNORMAL HIGH (ref 0–99)
NONHDL: 167
Triglycerides: 144 mg/dL (ref 0.0–149.0)
VLDL: 28.8 mg/dL (ref 0.0–40.0)

## 2017-09-30 LAB — BASIC METABOLIC PANEL
BUN: 17 mg/dL (ref 6–23)
CALCIUM: 10.4 mg/dL (ref 8.4–10.5)
CO2: 30 meq/L (ref 19–32)
CREATININE: 0.76 mg/dL (ref 0.40–1.20)
Chloride: 100 mEq/L (ref 96–112)
GFR: 78.4 mL/min (ref >60.00)
GLUCOSE: 97 mg/dL (ref 70–99)
Potassium: 4.1 mEq/L (ref 3.5–5.1)
SODIUM: 138 meq/L (ref 135–145)

## 2017-09-30 LAB — HEPATIC FUNCTION PANEL
ALK PHOS: 66 U/L (ref 39–117)
ALT: 21 U/L (ref 0–35)
AST: 18 U/L (ref 0–37)
Albumin: 4.4 g/dL (ref 3.5–5.2)
Bilirubin, Direct: 0.1 mg/dL (ref 0.0–0.3)
Total Bilirubin: 0.5 mg/dL (ref 0.2–1.2)
Total Protein: 6.7 g/dL (ref 6.0–8.3)

## 2017-09-30 LAB — TSH: TSH: 2.58 u[IU]/mL (ref 0.35–4.50)

## 2017-09-30 MED ORDER — DOXYCYCLINE HYCLATE 100 MG PO CAPS
100.0000 mg | ORAL_CAPSULE | Freq: Two times a day (BID) | ORAL | 0 refills | Status: DC
Start: 2017-09-30 — End: 2018-01-19

## 2017-09-30 MED ORDER — LISINOPRIL 20 MG PO TABS
20.0000 mg | ORAL_TABLET | Freq: Every day | ORAL | 3 refills | Status: DC
Start: 2017-09-30 — End: 2017-12-02

## 2017-09-30 NOTE — Progress Notes (Addendum)
Subjective:     Patient ID: [name], female   DOB: 1940-11-05, 77 y.o.   MRN: [id number]  HPI Patient seen for physical exam. She has history of hypertension and urinary urgency along with mild hyperlipidemia. She is currently on lisinopril HCTZ and she would like to see if she can come off HCTZ. Her blood pressures been very well controlled. She was tried on multiple medications per urology for urgency without much improvement.  Immunizations up-to-date with exception of has not had new shingles vaccine. Last mammogram was 2 years ago.  She has acute issue today of sinusitis symptoms for 2 weeks. She has frequent headaches and pansinusitis pressure with some greenish nasal discharge intermittently and occasional bloody nasal drainage. She's had increased malaise. No fever. Allergic to penicillin.  Past Medical History:  Diagnosis Date  . Arthritis   . Cataract   . Hypertension   . Osteoporosis    Past Surgical History:  Procedure Laterality Date  . BUNIONECTOMY    . CATARACTS    . COLONOSCOPY  06/2002  . JOINT REPLACEMENT  2009, 2010   knee replacements  . NASAL SINUS SURGERY    . VOCAL CORD POLYPS     REMOVED    reports that she quit smoking about 29 years ago. She has a 1.50 pack-year smoking history. she has never used smokeless tobacco. She reports that she does not drink alcohol or use drugs. family history includes Arthritis in her father; Cancer in her father; Colon cancer (age of onset: 15) in her sister. Allergies  Allergen Reactions  . Alendronate Sodium     REACTION: constipation  . Celecoxib     REACTION: GI upset  . Penicillins     REACTION: serious dehydration, hospitalized in 1990     Review of Systems  Constitutional: Positive for fatigue. Negative for activity change, appetite change, fever and unexpected weight change.  HENT: Positive for congestion, sinus pressure and sinus pain. Negative for ear pain, hearing loss, sore throat and trouble  swallowing.   Eyes: Negative for visual disturbance.  Respiratory: Negative for cough and shortness of breath.   Cardiovascular: Negative for chest pain and palpitations.  Gastrointestinal: Negative for abdominal pain, blood in stool, constipation and diarrhea.  Genitourinary: Negative for dysuria and hematuria.  Musculoskeletal: Negative for arthralgias, back pain and myalgias.  Skin: Negative for rash.  Neurological: Positive for headaches. Negative for dizziness and syncope.  Hematological: Negative for adenopathy.  Psychiatric/Behavioral: Negative for confusion and dysphoric mood.       Objective:   Physical Exam  Constitutional: She is oriented to person, place, and time. She appears well-developed and well-nourished.  HENT:  Head: Normocephalic and atraumatic.  She has some thick yellow mucous right naris otherwise clear  Eyes: EOM are normal. Pupils are equal, round, and reactive to light.  Neck: Normal range of motion. Neck supple. No thyromegaly present.  Cardiovascular: Normal rate, regular rhythm and normal heart sounds.  No murmur heard. Pulmonary/Chest: Breath sounds normal. No respiratory distress. She has no wheezes. She has no rales.  Abdominal: Soft. Bowel sounds are normal. She exhibits no distension and no mass. There is no tenderness. There is no rebound and no guarding.  Musculoskeletal: Normal range of motion. She exhibits no edema.  Lymphadenopathy:    She has no cervical adenopathy.  Neurological: She is alert and oriented to person, place, and time. She displays normal reflexes. No cranial nerve deficit.  Skin: No rash noted.  Psychiatric: She has  a normal mood and affect. Her behavior is normal. Judgment and thought content normal.       Assessment:     #1 Physical exam. Several issues addressed as below  #2 probable acute pansinusitis  # 3 hypertension  #4 hyperlipidemia.    Plan:     -Try switching her to plain lisinopril 20 mg daily to see  if this helps with her urgency -Follow-up in 2 months to reassess BP and in the meantime monitor blood pressure at least once weekly and be in touch if consistently greater than 140/90 -Set up repeat mammogram -Discussed new shingles vaccine and she will check on insurance coverage -Start doxycycline 100 mg twice daily for 10 days -Obtain screening labs -check BMP (dx=hypertension) and lipid/hepatic (dx=hyperlipidemia)  Kristian CoveyBruce W Peregrine Nolt MD [facility]

## 2017-09-30 NOTE — Patient Instructions (Signed)
Consider setting up repeat mammogram this year Consider new shingles vaccine (Shingrix)   Check on insurance coverage.   Monitor blood pressure and be int touch if consistently > 140/90

## 2017-12-01 ENCOUNTER — Telehealth: Payer: Self-pay | Admitting: *Deleted

## 2017-12-01 NOTE — Telephone Encounter (Signed)
lisinopril (PRINIVIL,ZESTRIL) 20 MG tablet [facility]  Patient says she needs Rx with diuretic.  Okay to fill?

## 2017-12-01 NOTE — Telephone Encounter (Signed)
Please have her elaborate on need for diuretic.  Edema?  Poor BP control?  Generally don't change BP medication over the phone without assessment.

## 2017-12-02 MED ORDER — LISINOPRIL-HYDROCHLOROTHIAZIDE 20-12.5 MG PO TABS: 1.0000 | ORAL_TABLET | Freq: Every day | ORAL | 3 refills | Status: DC

## 2017-12-02 NOTE — Telephone Encounter (Signed)
Patient has had lisinopril HCTZ in the past.  She tried the Lisinopril without HCTZ and her blood pressure was not well controlled.  She would like to go back on the lisinopril HCTZ.

## 2017-12-02 NOTE — Telephone Encounter (Signed)
We can change to Lisinopril HCTZ 20/12.5 mg one daily.  Make sure she has follow up in 1-2 month to reassess

## 2017-12-02 NOTE — Telephone Encounter (Signed)
Patient is aware and an appointment made 

## 2018-01-19 ENCOUNTER — Ambulatory Visit: Payer: Medicare Other | Admitting: Family Medicine

## 2018-01-19 ENCOUNTER — Encounter: Payer: Self-pay | Admitting: Family Medicine

## 2018-01-19 VITALS — BP 100/64 | HR 96 | Temp 97.9°F | Wt 160.6 lb

## 2018-01-19 DIAGNOSIS — S80811A Abrasion, right lower leg, initial encounter: Secondary | ICD-10-CM | POA: Diagnosis not present

## 2018-01-19 DIAGNOSIS — I10 Essential (primary) hypertension: Secondary | ICD-10-CM

## 2018-01-19 DIAGNOSIS — R109 Unspecified abdominal pain: Secondary | ICD-10-CM

## 2018-01-19 LAB — POCT URINALYSIS DIPSTICK
Bilirubin, UA: NEGATIVE
Glucose, UA: NEGATIVE
Ketones, UA: NEGATIVE
LEUKOCYTES UA: NEGATIVE
NITRITE UA: NEGATIVE
Protein, UA: NEGATIVE
RBC UA: NEGATIVE
SPEC GRAV UA: 1.015 (ref 1.010–1.025)
Urobilinogen, UA: 0.2 E.U./dL
pH, UA: 7 (ref 5.0–8.0)

## 2018-01-19 NOTE — Progress Notes (Signed)
  Subjective:     Patient ID: [name], female   DOB: 29-May-1941, 77 y.o.   MRN: [id number]  HPI Patient seen for the following issues  Follow-up hypertension. We had reduced her to just plain lisinopril recently but her blood pressure went back up and she is now back on lisinopril HCTZ and doing well. No lightheadedness or dizziness. Compliant with therapy. Recent electrolytes in January were stable  Mid right back pain mid thoracic area around the bra line region. Onset yesterday. She woke up with pain. Denies any injury. No pleuritic pain. No cough. No dyspnea. Achy quality. Improved with Tylenol. No rash. Worse with movement. No dysuria  Patient describes some redness right lower leg. Last Thursday she cut herself shaving and by Saturday had some mild erythema. Using Neosporin topically. Redness improved today. No fevers or chills. Tetanus up-to-date.  Past Medical History:  Diagnosis Date  . Arthritis   . Cataract   . Hypertension   . Osteoporosis    Past Surgical History:  Procedure Laterality Date  . BUNIONECTOMY    . CATARACTS    . COLONOSCOPY  06/2002  . JOINT REPLACEMENT  2009, 2010   knee replacements  . NASAL SINUS SURGERY    . VOCAL CORD POLYPS     REMOVED    reports that she quit smoking about 30 years ago. She has a 1.50 pack-year smoking history. She has never used smokeless tobacco. She reports that she does not drink alcohol or use drugs. family history includes Arthritis in her father; Cancer in her father; Colon cancer (age of onset: 15) in her sister. Allergies  Allergen Reactions  . Alendronate Sodium     REACTION: constipation  . Celecoxib     REACTION: GI upset  . Penicillins     REACTION: serious dehydration, hospitalized in 1990     Review of Systems  Constitutional: Negative for chills, fatigue and fever.  Eyes: Negative for visual disturbance.  Respiratory: Negative for cough, chest tightness, shortness of breath and wheezing.    Cardiovascular: Negative for chest pain, palpitations and leg swelling.  Genitourinary: Positive for flank pain. Negative for dysuria, frequency and hematuria.  Neurological: Negative for dizziness, seizures, syncope, weakness, light-headedness and headaches.       Objective:   Physical Exam  Constitutional: She appears well-developed and well-nourished.  Neck: Neck supple. No thyromegaly present.  Cardiovascular: Normal rate and regular rhythm.  Pulmonary/Chest: Effort normal and breath sounds normal. She has no wheezes. She has no rales.  Musculoskeletal: She exhibits no edema.  She has some mild muscular tenderness right mid thoracic region. No spinal tenderness. No skin rash in that region  Skin:  Small abrasion right lower leg. No erythema or warmth. No edema. Nontender.       Assessment:     #1 hypertension stable and at goal  #2 small abrasion right lower leg. No signs of secondary infection such as cellulitis  #3 mid thoracic back pain right side. Suspect muscular    Plan:     -Check urine dipstick=normal. -Continue Tylenol as needed for back pain -Keep superficial abrasion right leg clean with soap and water. Leave off Neosporin -Follow-up promptly for any surrounding redness or signs of cellulitis  Kristian Covey MD [facility]

## 2018-01-19 NOTE — Patient Instructions (Signed)
Keep right leg clean with soap and water Avoid further use of Neosporin Follow-up promptly for any signs of cellulitis such is increased redness, warmth, pain, or swelling  Continue Tylenol as needed for back pain Continue topical sports creams as needed Touch base in one week if pain not improving

## 2018-03-17 ENCOUNTER — Ambulatory Visit: Payer: Medicare Other | Admitting: Family Medicine

## 2018-04-20 ENCOUNTER — Encounter: Payer: Self-pay | Admitting: Family Medicine

## 2018-04-20 ENCOUNTER — Ambulatory Visit: Payer: Medicare Other | Admitting: Family Medicine

## 2018-04-20 VITALS — BP 110/70 | HR 87 | Temp 98.3°F | Wt 160.0 lb

## 2018-04-20 DIAGNOSIS — J019 Acute sinusitis, unspecified: Secondary | ICD-10-CM

## 2018-04-20 MED ORDER — DOXYCYCLINE HYCLATE 100 MG PO CAPS: 100.0000 mg | ORAL_CAPSULE | Freq: Two times a day (BID) | ORAL | 0 refills | Status: DC

## 2018-04-20 NOTE — Progress Notes (Signed)
  Subjective:     Patient ID: [name], female   DOB: 1941-03-13, 77 y.o.   MRN: [id number]  HPI Patient seen with over one week history of nasal congestion, postnasal drainage, intermittent sore throat, cough. She's had some laryngitis past few days. Increased sinus pressure. She's tried over-the-counter medications without much improvement. No dyspnea. Occasional mild headache relieved with Tylenol.  Past Medical History:  Diagnosis Date  . Arthritis   . Cataract   . Hypertension   . Osteoporosis    Past Surgical History:  Procedure Laterality Date  . BUNIONECTOMY    . CATARACTS    . COLONOSCOPY  06/2002  . JOINT REPLACEMENT  2009, 2010   knee replacements  . NASAL SINUS SURGERY    . VOCAL CORD POLYPS     REMOVED    reports that she quit smoking about 30 years ago. She has a 1.50 pack-year smoking history. She has never used smokeless tobacco. She reports that she does not drink alcohol or use drugs. family history includes Arthritis in her father; Cancer in her father; Colon cancer (age of onset: 5277) in her sister. Allergies  Allergen Reactions  . Alendronate Sodium     REACTION: constipation  . Celecoxib     REACTION: GI upset  . Penicillins     REACTION: serious dehydration, hospitalized in 1990     Review of Systems  Constitutional: Negative for chills and fever.  HENT: Positive for congestion, sinus pressure and sore throat.   Respiratory: Positive for cough. Negative for shortness of breath and wheezing.   Cardiovascular: Negative for chest pain.       Objective:   Physical Exam  Constitutional: She appears well-developed and well-nourished.  HENT:  Right Ear: Tympanic membrane normal.  Left Ear: Tympanic membrane normal.  Mouth/Throat: Oropharynx is clear and moist. No oropharyngeal exudate.  Neck: Neck supple.  Cardiovascular: Normal rate.  Pulmonary/Chest: Effort normal and breath sounds normal. She has no wheezes. She has no rales.   Lymphadenopathy:    She has no cervical adenopathy.       Assessment:     Acute bronchitis/sinusitis. We explained these are usually viral though patient states she is feeling worse compared to several days ago    Plan:     -Stay well-hydrated -Consider doxycycline 100 mg twice a day for 10 days -consider OTC plain Mucinex.  Kristian CoveyBruce W Aashka Salomone MD [facility]

## 2018-04-20 NOTE — Patient Instructions (Signed)

## 2018-05-04 ENCOUNTER — Telehealth: Payer: Self-pay | Admitting: Family Medicine

## 2018-05-04 DIAGNOSIS — J029 Acute pharyngitis, unspecified: Secondary | ICD-10-CM

## 2018-05-04 DIAGNOSIS — R490 Dysphonia: Secondary | ICD-10-CM

## 2018-05-04 NOTE — Telephone Encounter (Signed)
Copied from CRM [id number]. Topic: Referral - Request >> May 04, 2018  8:42 AM Terisa Starraylor, Brittany L wrote: Reason for CRM: Patient is requesting that she be referred to a ENT for the stone in her salvia gland. Patient was in on 7/29 for coughing/sore throat and was diagnosed with bronchitis. Patient is not sure where she needs to go but wants to be seen for this because she finished the antibiotic and now she can get rid of the " hoarseness "

## 2018-05-04 NOTE — Telephone Encounter (Signed)
OK to refer.

## 2018-05-04 NOTE — Telephone Encounter (Signed)
Okay to refer? 

## 2018-05-04 NOTE — Telephone Encounter (Signed)
Referral placed.

## 2018-11-12 ENCOUNTER — Other Ambulatory Visit (INDEPENDENT_AMBULATORY_CARE_PROVIDER_SITE_OTHER): Payer: Medicare Other

## 2018-11-12 ENCOUNTER — Other Ambulatory Visit: Payer: Self-pay | Admitting: Orthopedic Surgery

## 2018-11-12 DIAGNOSIS — R52 Pain, unspecified: Secondary | ICD-10-CM

## 2018-11-12 DIAGNOSIS — M25511 Pain in right shoulder: Secondary | ICD-10-CM

## 2018-12-10 ENCOUNTER — Other Ambulatory Visit: Payer: Self-pay | Admitting: Family Medicine

## 2019-03-13 ENCOUNTER — Other Ambulatory Visit: Payer: Self-pay | Admitting: Family Medicine

## 2019-03-16 ENCOUNTER — Ambulatory Visit (INDEPENDENT_AMBULATORY_CARE_PROVIDER_SITE_OTHER): Payer: Medicare Other | Admitting: Family Medicine

## 2019-03-16 ENCOUNTER — Other Ambulatory Visit: Payer: Self-pay

## 2019-03-16 DIAGNOSIS — I10 Essential (primary) hypertension: Secondary | ICD-10-CM

## 2019-03-16 MED ORDER — LISINOPRIL-HYDROCHLOROTHIAZIDE 20-12.5 MG PO TABS: 1.0000 | ORAL_TABLET | Freq: Every day | ORAL | 3 refills | Status: DC

## 2019-03-16 NOTE — Progress Notes (Signed)
This visit type was conducted due to national recommendations for restrictions regarding the COVID-19 pandemic in an effort to limit this patient's exposure and mitigate transmission in our community.   Virtual Visit via Video Note  I connected with [name] on 03/16/19 at  2:00 PM EDT by a video enabled telemedicine application and verified that I am speaking with the correct person using two identifiers.  Location patient: home Location provider:work or home office Persons participating in the virtual visit: patient, provider  I discussed the limitations of evaluation and management by telemedicine and the availability of in person appointments. The patient expressed understanding and agreed to proceed.   HPI: Patient needs refills of lisinopril HCTZ.  She has a son who helps care for her and is getting her groceries.  He usually monitors her blood pressure but not recently.  She is on any headaches or dizziness.  She is compliant with therapy.  Her blood pressures been very well controlled in the past.  She has not had a labs in over a year but she is reluctant to get out of this point.  ROS: See pertinent positives and negatives per HPI.  Past Medical History:  Diagnosis Date  . Arthritis   . Cataract   . Hypertension   . Osteoporosis     Past Surgical History:  Procedure Laterality Date  . BUNIONECTOMY    . CATARACTS    . COLONOSCOPY  06/2002  . JOINT REPLACEMENT  2009, 2010   knee replacements  . NASAL SINUS SURGERY    . VOCAL CORD POLYPS     REMOVED    Family History  Problem Relation Age of Onset  . Colon cancer Sister 62  . Cancer Father        prostate  . Arthritis Father     SOCIAL HX: Patient is widowed.  Non-smoker.  Very supportive son who lives next door   Current Outpatient Medications:  .  acetaminophen (TYLENOL) 650 MG CR tablet, Take 1,300 mg by mouth every 8 (eight) hours as needed (For arthritis pain.)., Disp: , Rfl:  .  doxycycline  (VIBRAMYCIN) 100 MG capsule, Take 1 capsule (100 mg total) by mouth 2 (two) times daily., Disp: 20 capsule, Rfl: 0 .  lisinopril-hydrochlorothiazide (ZESTORETIC) 20-12.5 MG tablet, Take 1 tablet by mouth daily., Disp: 90 tablet, Rfl: 3  EXAM:  VITALS per patient if applicable:  GENERAL: alert, oriented, appears well and in no acute distress  HEENT: atraumatic, conjunttiva clear, no obvious abnormalities on inspection of external nose and ears  NECK: normal movements of the head and neck  LUNGS: on inspection no signs of respiratory distress, breathing rate appears normal, no obvious gross SOB, gasping or wheezing  CV: no obvious cyanosis  MS: moves all visible extremities without noticeable abnormality  PSYCH/NEURO: pleasant and cooperative, no obvious depression or anxiety, speech and thought processing grossly intact  ASSESSMENT AND PLAN:  Discussed the following assessment and plan:  Essential hypertension -history of good control  -Refill lisinopril HCTZ for 1 year -She is due for follow-up labs with electrolytes but she would like to wait until current things settle down with pandemic   I discussed the assessment and treatment plan with the patient. The patient was provided an opportunity to ask questions and all were answered. The patient agreed with the plan and demonstrated an understanding of the instructions.   The patient was advised to call back or seek an in-person evaluation if the symptoms worsen or if  the condition fails to improve as anticipated.   Carolann Littler, MD

## 2019-08-10 ENCOUNTER — Telehealth: Payer: Self-pay

## 2019-08-10 NOTE — Telephone Encounter (Signed)
I see she had a Zoster vaccine on 06/04/2012. Please advise.  Copied from [facility] [id number]. Topic: General - Inquiry >> Aug 09, 2019  1:41 PM Richardo Priest, [place] wrote: Reason for CRM: Pt called in stating she would like to know if she has had the shingrix vaccination or she is needing to have it done. Please advise.

## 2019-08-10 NOTE — Telephone Encounter (Signed)
Called patient and gave her information from Dr. Elease Hashimoto. Patient verbalized an understanding.

## 2019-08-10 NOTE — Telephone Encounter (Signed)
We do not have a record her having Shingrix unless she had this at the pharmacy.  She should check with her pharmacy but I would advise considering Shingrix since it is better efficacy than the Zostavax

## 2019-11-15 ENCOUNTER — Ambulatory Visit (INDEPENDENT_AMBULATORY_CARE_PROVIDER_SITE_OTHER): Payer: Medicare PPO | Admitting: *Deleted

## 2019-11-15 ENCOUNTER — Other Ambulatory Visit: Payer: Self-pay

## 2019-11-15 DIAGNOSIS — Z0000 Encounter for general adult medical examination without abnormal findings: Secondary | ICD-10-CM | POA: Diagnosis not present

## 2019-11-15 NOTE — Patient Instructions (Signed)
Please schedule your next medicare wellness visit with me in 1 yr.  Continue to eat heart healthy diet (full of fruits, vegetables, whole grains, lean protein, water--limit salt, fat, and sugar intake) and increase physical activity as tolerated.'  Continue doing brain stimulating activities (puzzles, reading, adult coloring books, staying active) to keep memory sharp.   Bring a copy of your living will and/or healthcare power of attorney to your next office visit.   [name] , Thank you for taking time to come for your Medicare Wellness Visit. I appreciate your ongoing commitment to your health goals. Please review the following plan we discussed and let me know if I can assist you in the future.   These are the goals we discussed: Goals    . Maintain health and independence       This is a list of the screening recommended for you and due dates:  Health Maintenance  Topic Date Due  . Tetanus Vaccine  06/20/2027  . Flu Shot  Completed  . DEXA scan (bone density measurement)  Completed  . Pneumonia vaccines  Completed    Preventive Care 33 Years and Older, Female Preventive care refers to lifestyle choices and visits with your health care provider that can promote health and wellness. This includes:  A yearly physical exam. This is also called an annual well check.  Regular dental and eye exams.  Immunizations.  Screening for certain conditions.  Healthy lifestyle choices, such as diet and exercise. What can I expect for my preventive care visit? Physical exam Your health care provider will check:  Height and weight. These may be used to calculate body mass index (BMI), which is a measurement that tells if you are at a healthy weight.  Heart rate and blood pressure.  Your skin for abnormal spots. Counseling Your health care provider may ask you questions about:  Alcohol, tobacco, and drug use.  Emotional well-being.  Home and relationship well-being.  Sexual  activity.  Eating habits.  History of falls.  Memory and ability to understand (cognition).  Work and work Statistician.  Pregnancy and menstrual history. What immunizations do I need?  Influenza (flu) vaccine  This is recommended every year. Tetanus, diphtheria, and pertussis (Tdap) vaccine  You may need a Td booster every 10 years. Varicella (chickenpox) vaccine  You may need this vaccine if you have not already been vaccinated. Zoster (shingles) vaccine  You may need this after age 70. Pneumococcal conjugate (PCV13) vaccine  One dose is recommended after age 48. Pneumococcal polysaccharide (PPSV23) vaccine  One dose is recommended after age 16. Measles, mumps, and rubella (MMR) vaccine  You may need at least one dose of MMR if you were born in 1957 or later. You may also need a second dose. Meningococcal conjugate (MenACWY) vaccine  You may need this if you have certain conditions. Hepatitis A vaccine  You may need this if you have certain conditions or if you travel or work in places where you may be exposed to hepatitis A. Hepatitis B vaccine  You may need this if you have certain conditions or if you travel or work in places where you may be exposed to hepatitis B. Haemophilus influenzae type b (Hib) vaccine  You may need this if you have certain conditions. You may receive vaccines as individual doses or as more than one vaccine together in one shot (combination vaccines). Talk with your health care provider about the risks and benefits of combination vaccines. What tests  do I need? Blood tests  Lipid and cholesterol levels. These may be checked every 5 years, or more frequently depending on your overall health.  Hepatitis C test.  Hepatitis B test. Screening  Lung cancer screening. You may have this screening every year starting at age 8 if you have a 30-pack-year history of smoking and currently smoke or have quit within the past 15  years.  Colorectal cancer screening. All adults should have this screening starting at age 29 and continuing until age 4. Your health care provider may recommend screening at age 13 if you are at increased risk. You will have tests every 1-10 years, depending on your results and the type of screening test.  Diabetes screening. This is done by checking your blood sugar (glucose) after you have not eaten for a while (fasting). You may have this done every 1-3 years.  Mammogram. This may be done every 1-2 years. Talk with your health care provider about how often you should have regular mammograms.  BRCA-related cancer screening. This may be done if you have a family history of breast, ovarian, tubal, or peritoneal cancers. Other tests  Sexually transmitted disease (STD) testing.  Bone density scan. This is done to screen for osteoporosis. You may have this done starting at age 88. Follow these instructions at home: Eating and drinking  Eat a diet that includes fresh fruits and vegetables, whole grains, lean protein, and low-fat dairy products. Limit your intake of foods with high amounts of sugar, saturated fats, and salt.  Take vitamin and mineral supplements as recommended by your health care provider.  Do not drink alcohol if your health care provider tells you not to drink.  If you drink alcohol: ? Limit how much you have to 0-1 drink a day. ? Be aware of how much alcohol is in your drink. In the [place] one drink equals one 12 oz bottle of beer (355 mL), one 5 oz glass of wine (148 mL), or one 1 oz glass of hard liquor (44 mL). Lifestyle  Take daily care of your teeth and gums.  Stay active. Exercise for at least 30 minutes on 5 or more days each week.  Do not use any products that contain nicotine or tobacco, such as cigarettes, e-cigarettes, and chewing tobacco. If you need help quitting, ask your health care provider.  If you are sexually active, practice safe sex. Use a  condom or other form of protection in order to prevent STIs (sexually transmitted infections).  Talk with your health care provider about taking a low-dose aspirin or statin. What's next?  Go to your health care provider once a year for a well check visit.  Ask your health care provider how often you should have your eyes and teeth checked.  Stay up to date on all vaccines. This information is not intended to replace advice given to you by your health care provider. Make sure you discuss any questions you have with your health care provider. Document Revised: 09/03/2018 Document Reviewed: 09/03/2018 Elsevier Patient Education  2020 [employer].

## 2019-11-15 NOTE — Progress Notes (Signed)
Virtual Visit via Audio Note  I connected with patient on 11/15/19 at  9:00 AM EST by audio enabled telemedicine application and verified that I am speaking with the correct person using two identifiers.   THIS ENCOUNTER IS A VIRTUAL VISIT DUE TO COVID-19 - PATIENT WAS NOT SEEN IN THE OFFICE. PATIENT HAS CONSENTED TO VIRTUAL VISIT / TELEMEDICINE VISIT   Location of patient: home  Location of provider: office  I discussed the limitations of evaluation and management by telemedicine and the availability of in person appointments. The patient expressed understanding and agreed to proceed.   Subjective:   [name] is a 79 y.o. female who presents for an Initial Medicare Annual Wellness Visit.   Review of Systems   Cardiac Risk Factors include: advanced age (>67men, >58 women);hypertension Home Safety/Smoke Alarms: Feels safe in home. Smoke alarms in place.   Lives alone in 1 story home w/ basement. No trouble w/ stairs. Son lives across the street.  Female:      Mammo- declines       Dexa scan- declines        CCS-07/22/11    Objective:     Advanced Directives 11/15/2019 11/26/2016 10/01/2016  Does Patient Have a Medical Advance Directive? Yes Yes Yes  Type of Paramedic of [place];Living will - -  Does patient want to make changes to medical advance directive? No - Patient declined - -  Copy of [place] in Chart? No - copy requested - -    Current Medications (verified) Outpatient Encounter Medications as of 11/15/2019  Medication Sig  . lisinopril-hydrochlorothiazide (ZESTORETIC) 20-12.5 MG tablet Take 1 tablet by mouth daily.  Marland Kitchen acetaminophen (TYLENOL) 650 MG CR tablet Take 1,300 mg by mouth every 8 (eight) hours as needed (For arthritis pain.).  . [DISCONTINUED] doxycycline (VIBRAMYCIN) 100 MG capsule Take 1 capsule (100 mg total) by mouth 2 (two) times daily.   No facility-administered encounter medications on file as of  11/15/2019.    Allergies (verified) Alendronate sodium, Celecoxib, and Penicillins   History: Past Medical History:  Diagnosis Date  . Arthritis   . Cataract   . Hypertension   . Osteoporosis    Past Surgical History:  Procedure Laterality Date  . BUNIONECTOMY    . CATARACTS    . COLONOSCOPY  06/2002  . JOINT REPLACEMENT  2009, 2010   knee replacements  . NASAL SINUS SURGERY    . VOCAL CORD POLYPS     REMOVED   Family History  Problem Relation Age of Onset  . Colon cancer Sister 55  . Cancer Father        prostate  . Arthritis Father    Social History   Socioeconomic History  . Marital status: Married    Spouse name: Not on file  . Number of children: Not on file  . Years of education: Not on file  . Highest education level: Not on file  Occupational History  . Not on file  Tobacco Use  . Smoking status: Former Smoker    Packs/day: 0.50    Years: 3.00    Pack years: 1.50    Quit date: 11/08/1987    Years since quitting: 32.0  . Smokeless tobacco: Never Used  Substance and Sexual Activity  . Alcohol use: No  . Drug use: No  . Sexual activity: Not on file  Other Topics Concern  . Not on file  Social History Narrative  . Not on  file   Social Determinants of Health   Financial Resource Strain: Low Risk   . Difficulty of Paying Living Expenses: Not hard at all  Food Insecurity:   . Worried About Programme researcher, broadcasting/film/video in the Last Year: Not on file  . Ran Out of Food in the Last Year: Not on file  Transportation Needs: No Transportation Needs  . Lack of Transportation (Medical): No  . Lack of Transportation (Non-Medical): No  Physical Activity:   . Days of Exercise per Week: Not on file  . Minutes of Exercise per Session: Not on file  Stress:   . Feeling of Stress : Not on file  Social Connections:   . Frequency of Communication with Friends and Family: Not on file  . Frequency of Social Gatherings with Friends and Family: Not on file  . Attends  Religious Services: Not on file  . Active Member of Clubs or Organizations: Not on file  . Attends Banker Meetings: Not on file  . Marital Status: Not on file    Tobacco Counseling Counseling given: Not Answered   Clinical Intake: Pain : No/denies pain    Activities of Daily Living In your present state of health, do you have any difficulty performing the following activities: 11/15/2019  Hearing? N  Vision? N  Difficulty concentrating or making decisions? N  Walking or climbing stairs? N  Dressing or bathing? N  Doing errands, shopping? N  Preparing Food and eating ? N  Using the Toilet? N  In the past six months, have you accidently leaked urine? Y  Comment wears panty liner  Do you have problems with loss of bowel control? N  Managing your Medications? N  Managing your Finances? N  Housekeeping or managing your Housekeeping? N  Some recent data might be hidden     Immunizations and Health Maintenance Immunization History  Administered Date(s) Administered  . Influenza Split 07/17/2011, 06/04/2012  . Influenza Whole 08/02/2009, 06/14/2010  . Influenza, High Dose Seasonal PF 07/07/2013, 07/11/2015, 07/29/2016, 06/19/2017  . Influenza,inj,Quad PF,6+ Mos 05/26/2014, 06/29/2018  . Influenza,inj,quad, With Preservative 07/02/2019  . Influenza-Unspecified 07/02/2019  . Pneumococcal Conjugate-13 07/11/2015  . Pneumococcal Polysaccharide-23 09/23/2006  . Td 09/23/2006, 06/19/2017  . Zoster 06/04/2012   There are no preventive care reminders to display for this patient.  Patient Care Team: Kristian Covey, MD as PCP - General  Indicate any recent Medical Services you may have received from other than [facility] providers in the past year (date may be approximate).     Assessment:   This is a routine wellness examination for [name]. Physical assessment deferred to PCP.  Hearing/Vision screen Unable to assess. This visit is enabled though telemedicine due  to Covid 19.   Dietary issues and exercise activities discussed: Current Exercise Habits: The patient does not participate in regular exercise at present, Exercise limited by: None identified Diet (meal preparation, eat out, water intake, caffeinated beverages, dairy products, fruits and vegetables): in general, a "healthy" diet  , well balanced      Goals    . Maintain health and independence      Depression Screen PHQ 2/9 Scores 11/15/2019 07/29/2016 08/10/2014 08/10/2013  PHQ - 2 Score 0 0 0 0    Fall Risk Fall Risk  11/15/2019 07/29/2016 08/10/2014 08/10/2013  Falls in the past year? 0 No No No  Number falls in past yr: 0 - - -  Follow up Education provided;Falls prevention discussed - - -  Cognitive Function: Ad8 score reviewed for issues:  Issues making decisions:no  Less interest in hobbies / activities:no  Repeats questions, stories (family complaining):no  Trouble using ordinary gadgets (microwave, computer, phone):no  Forgets the month or year: no  Mismanaging finances: no  Remembering appts:no  Daily problems with thinking and/or memory:no Ad8 score is=0        Screening Tests Health Maintenance  Topic Date Due  . TETANUS/TDAP  06/20/2027  . INFLUENZA VACCINE  Completed  . DEXA SCAN  Completed  . PNA vac Low Risk Adult  Completed      Plan:    Please schedule your next medicare wellness visit with me in 1 yr.  Continue to eat heart healthy diet (full of fruits, vegetables, whole grains, lean protein, water--limit salt, fat, and sugar intake) and increase physical activity as tolerated.'  Continue doing brain stimulating activities (puzzles, reading, adult coloring books, staying active) to keep memory sharp.   Bring a copy of your living will and/or healthcare power of attorney to your next office visit.   I have personally reviewed and noted the following in the patient's chart:   . Medical and social history . Use of alcohol, tobacco or  illicit drugs  . Current medications and supplements . Functional ability and status . Nutritional status . Physical activity . Advanced directives . List of other physicians . Hospitalizations, surgeries, and ER visits in previous 12 months . Vitals . Screenings to include cognitive, depression, and falls . Referrals and appointments  In addition, I have reviewed and discussed with patient certain preventive protocols, quality metrics, and best practice recommendations. A written personalized care plan for preventive services as well as general preventive health recommendations were provided to patient.     Mady Haagensen [place], [place]   11/15/2019

## 2020-03-01 ENCOUNTER — Other Ambulatory Visit: Payer: Self-pay | Admitting: Family Medicine

## 2020-03-25 DIAGNOSIS — J0100 Acute maxillary sinusitis, unspecified: Secondary | ICD-10-CM | POA: Diagnosis not present

## 2020-03-25 DIAGNOSIS — W57XXXA Bitten or stung by nonvenomous insect and other nonvenomous arthropods, initial encounter: Secondary | ICD-10-CM | POA: Diagnosis not present

## 2020-03-25 DIAGNOSIS — R42 Dizziness and giddiness: Secondary | ICD-10-CM | POA: Diagnosis not present

## 2020-03-25 DIAGNOSIS — S40861A Insect bite (nonvenomous) of right upper arm, initial encounter: Secondary | ICD-10-CM | POA: Diagnosis not present

## 2020-05-30 ENCOUNTER — Other Ambulatory Visit: Payer: Self-pay | Admitting: Family Medicine

## 2020-06-20 ENCOUNTER — Encounter: Payer: Self-pay | Admitting: Family Medicine

## 2020-06-20 ENCOUNTER — Ambulatory Visit: Payer: Medicare PPO | Admitting: Family Medicine

## 2020-06-20 ENCOUNTER — Other Ambulatory Visit: Payer: Self-pay

## 2020-06-20 VITALS — BP 124/72 | HR 73 | Temp 97.8°F | Ht <= 58 in | Wt 159.3 lb

## 2020-06-20 DIAGNOSIS — I10 Essential (primary) hypertension: Secondary | ICD-10-CM | POA: Diagnosis not present

## 2020-06-20 DIAGNOSIS — Z0000 Encounter for general adult medical examination without abnormal findings: Secondary | ICD-10-CM | POA: Diagnosis not present

## 2020-06-20 DIAGNOSIS — Z23 Encounter for immunization: Secondary | ICD-10-CM

## 2020-06-20 MED ORDER — LISINOPRIL-HYDROCHLOROTHIAZIDE 20-12.5 MG PO TABS
1.0000 | ORAL_TABLET | Freq: Every day | ORAL | 3 refills | Status: DC
Start: 2020-06-20 — End: 2021-08-28

## 2020-06-20 NOTE — Patient Instructions (Signed)
Preventive Care 80 Years and Older, Female Preventive care refers to lifestyle choices and visits with your health care provider that can promote health and wellness. This includes:  A yearly physical exam. This is also called an annual well check.  Regular dental and eye exams.  Immunizations.  Screening for certain conditions.  Healthy lifestyle choices, such as diet and exercise. What can I expect for my preventive care visit? Physical exam Your health care provider will check:  Height and weight. These may be used to calculate body mass index (BMI), which is a measurement that tells if you are at a healthy weight.  Heart rate and blood pressure.  Your skin for abnormal spots. Counseling Your health care provider may ask you questions about:  Alcohol, tobacco, and drug use.  Emotional well-being.  Home and relationship well-being.  Sexual activity.  Eating habits.  History of falls.  Memory and ability to understand (cognition).  Work and work Statistician.  Pregnancy and menstrual history. What immunizations do I need?  Influenza (flu) vaccine  This is recommended every year. Tetanus, diphtheria, and pertussis (Tdap) vaccine  You may need a Td booster every 10 years. Varicella (chickenpox) vaccine  You may need this vaccine if you have not already been vaccinated. Zoster (shingles) vaccine  You may need this after age 33. Pneumococcal conjugate (PCV13) vaccine  One dose is recommended after age 33. Pneumococcal polysaccharide (PPSV23) vaccine  One dose is recommended after age 72. Measles, mumps, and rubella (MMR) vaccine  You may need at least one dose of MMR if you were born in 1957 or later. You may also need a second dose. Meningococcal conjugate (MenACWY) vaccine  You may need this if you have certain conditions. Hepatitis A vaccine  You may need this if you have certain conditions or if you travel or work in places where you may be exposed  to hepatitis A. Hepatitis B vaccine  You may need this if you have certain conditions or if you travel or work in places where you may be exposed to hepatitis B. Haemophilus influenzae type b (Hib) vaccine  You may need this if you have certain conditions. You may receive vaccines as individual doses or as more than one vaccine together in one shot (combination vaccines). Talk with your health care provider about the risks and benefits of combination vaccines. What tests do I need? Blood tests  Lipid and cholesterol levels. These may be checked every 5 years, or more frequently depending on your overall health.  Hepatitis C test.  Hepatitis B test. Screening  Lung cancer screening. You may have this screening every year starting at age 39 if you have a 30-pack-year history of smoking and currently smoke or have quit within the past 15 years.  Colorectal cancer screening. All adults should have this screening starting at age 36 and continuing until age 15. Your health care provider may recommend screening at age 23 if you are at increased risk. You will have tests every 1-10 years, depending on your results and the type of screening test.  Diabetes screening. This is done by checking your blood sugar (glucose) after you have not eaten for a while (fasting). You may have this done every 1-3 years.  Mammogram. This may be done every 1-2 years. Talk with your health care provider about how often you should have regular mammograms.  BRCA-related cancer screening. This may be done if you have a family history of breast, ovarian, tubal, or peritoneal cancers.  Other tests  Sexually transmitted disease (STD) testing.  Bone density scan. This is done to screen for osteoporosis. You may have this done starting at age 33. Follow these instructions at home: Eating and drinking  Eat a diet that includes fresh fruits and vegetables, whole grains, lean protein, and low-fat dairy products. Limit  your intake of foods with high amounts of sugar, saturated fats, and salt.  Take vitamin and mineral supplements as recommended by your health care provider.  Do not drink alcohol if your health care provider tells you not to drink.  If you drink alcohol: ? Limit how much you have to 0-1 drink a day. ? Be aware of how much alcohol is in your drink. In the [place] one drink equals one 12 oz bottle of beer (355 mL), one 5 oz glass of wine (148 mL), or one 1 oz glass of hard liquor (44 mL). Lifestyle  Take daily care of your teeth and gums.  Stay active. Exercise for at least 30 minutes on 5 or more days each week.  Do not use any products that contain nicotine or tobacco, such as cigarettes, e-cigarettes, and chewing tobacco. If you need help quitting, ask your health care provider.  If you are sexually active, practice safe sex. Use a condom or other form of protection in order to prevent STIs (sexually transmitted infections).  Talk with your health care provider about taking a low-dose aspirin or statin. What's next?  Go to your health care provider once a year for a well check visit.  Ask your health care provider how often you should have your eyes and teeth checked.  Stay up to date on all vaccines. This information is not intended to replace advice given to you by your health care provider. Make sure you discuss any questions you have with your health care provider. Document Revised: 09/03/2018 Document Reviewed: 09/03/2018 Elsevier Patient Education  2020 [employer].  Try getting back on the Flonase daily for allergy symptoms

## 2020-06-20 NOTE — Progress Notes (Signed)
Established Patient Office Visit  Subjective:  Patient ID: [name], female    DOB: 1941-01-03  Age: 79 y.o. MRN: [id number]  CC:  Chief Complaint  Patient presents with  . Annual Exam    Doing okay    HPI [name] presents for physical exam.  She has history of hypertension takes lisinopril HCTZ.  Requesting refills.  Generally doing well.  She still lives up in [place] and lives independently.  Her son lives next door.  He just retired.  She does have past history of osteoporosis and mild hyperlipidemia.  She has declined osteoporosis therapies in the past.  Health maintenance reviewed  -Needs flu vaccine -Had Covid vaccine with Moderna but does not have dates.  No booster yet. -Pneumonia vaccines complete -Had Zostavax 2013.  Declines Shingrix. -No mammogram in about 2 years.  She is considering scheduling this. -No history of hepatitis C screening -She states her last colonoscopy was less than 5 years ago.  She is not sure she will get any further screenings at this point  Hypertension well-controlled with lisinopril HCTZ.  No recent dizziness.  Compliant with therapy.  No recent headaches or chest pain.  She has allergy symptoms that are currently not well controlled with antihistamine.  She tried Allegra, Claritin, and Zyrtec without much relief.  She has Flonase but not using.   Past Medical History:  Diagnosis Date  . Arthritis   . Cataract   . Hypertension   . Osteoporosis     Past Surgical History:  Procedure Laterality Date  . BUNIONECTOMY    . CATARACTS    . COLONOSCOPY  06/2002  . JOINT REPLACEMENT  2009, 2010   knee replacements  . NASAL SINUS SURGERY    . VOCAL CORD POLYPS     REMOVED    Family History  Problem Relation Age of Onset  . Colon cancer Sister 49  . Cancer Father        prostate  . Arthritis Father     Social History   Socioeconomic History  . Marital status: Married    Spouse name: Not on file  . Number of  children: Not on file  . Years of education: Not on file  . Highest education level: Not on file  Occupational History  . Not on file  Tobacco Use  . Smoking status: Former Smoker    Packs/day: 0.50    Years: 3.00    Pack years: 1.50    Quit date: 11/08/1987    Years since quitting: 32.6  . Smokeless tobacco: Never Used  Vaping Use  . Vaping Use: Never used  Substance and Sexual Activity  . Alcohol use: No  . Drug use: No  . Sexual activity: Not on file  Other Topics Concern  . Not on file  Social History Narrative  . Not on file   Social Determinants of Health   Financial Resource Strain: Low Risk   . Difficulty of Paying Living Expenses: Not hard at all  Food Insecurity:   . Worried About Programme researcher, broadcasting/film/video in the Last Year: Not on file  . Ran Out of Food in the Last Year: Not on file  Transportation Needs: No Transportation Needs  . Lack of Transportation (Medical): No  . Lack of Transportation (Non-Medical): No  Physical Activity:   . Days of Exercise per Week: Not on file  . Minutes of Exercise per Session: Not on file  Stress:   .  Feeling of Stress : Not on file  Social Connections:   . Frequency of Communication with Friends and Family: Not on file  . Frequency of Social Gatherings with Friends and Family: Not on file  . Attends Religious Services: Not on file  . Active Member of Clubs or Organizations: Not on file  . Attends Banker Meetings: Not on file  . Marital Status: Not on file  Intimate Partner Violence:   . Fear of Current or Ex-Partner: Not on file  . Emotionally Abused: Not on file  . Physically Abused: Not on file  . Sexually Abused: Not on file    Outpatient Medications Prior to Visit  Medication Sig Dispense Refill  . acetaminophen (TYLENOL) 650 MG CR tablet Take 1,300 mg by mouth every 8 (eight) hours as needed (For arthritis pain.).    Marland Kitchen meclizine (ANTIVERT) 25 MG tablet Take 25 mg by mouth 3 (three) times daily as needed.     Marland Kitchen lisinopril-hydrochlorothiazide (ZESTORETIC) 20-12.5 MG tablet TAKE 1 TABLET DAILY 90 tablet 0   No facility-administered medications prior to visit.    Allergies  Allergen Reactions  . Alendronate Sodium     REACTION: constipation  . Aspirin   . Celecoxib     REACTION: GI upset  . Penicillins     REACTION: serious dehydration, hospitalized in 1990    ROS Review of Systems  Constitutional: Negative for chills, fatigue, fever and unexpected weight change.  HENT: Positive for congestion and postnasal drip.   Eyes: Negative for visual disturbance.  Respiratory: Negative for cough, chest tightness, shortness of breath and wheezing.   Cardiovascular: Negative for chest pain, palpitations and leg swelling.  Gastrointestinal: Negative for abdominal pain.  Endocrine: Negative for polydipsia and polyuria.  Genitourinary: Negative for dysuria.  Neurological: Negative for dizziness, seizures, syncope, weakness, light-headedness and headaches.  Psychiatric/Behavioral: Negative for dysphoric mood.      Objective:    Physical Exam Vitals reviewed.  Constitutional:      Appearance: Normal appearance.  HENT:     Right Ear: Tympanic membrane normal.     Left Ear: Tympanic membrane normal.  Cardiovascular:     Rate and Rhythm: Normal rate and regular rhythm.  Pulmonary:     Effort: Pulmonary effort is normal.     Breath sounds: Normal breath sounds.  Abdominal:     Palpations: Abdomen is soft.     Tenderness: There is no abdominal tenderness.  Musculoskeletal:     Cervical back: Normal range of motion.     Right lower leg: No edema.     Left lower leg: No edema.  Lymphadenopathy:     Cervical: No cervical adenopathy.  Neurological:     Mental Status: She is alert.     BP 124/72   Pulse 73   Temp 97.8 F (36.6 C) (Oral)   Ht 4\' 8"  (1.422 m)   Wt 159 lb 4.8 oz (72.3 kg)   BMI 35.71 kg/m  Wt Readings from Last 3 Encounters:  06/20/20 159 lb 4.8 oz (72.3 kg)   04/20/18 160 lb (72.6 kg)  01/19/18 160 lb 9.6 oz (72.8 kg)     Health Maintenance Due  Topic Date Due  . Hepatitis C Screening  Never done  . COVID-19 Vaccine (1) Never done    There are no preventive care reminders to display for this patient.  Lab Results  Component Value Date   TSH 2.58 09/30/2017   Lab Results  Component Value Date  WBC 7.2 09/30/2017   HGB 14.7 09/30/2017   HCT 44.4 09/30/2017   MCV 97.5 09/30/2017   PLT 265.0 09/30/2017   Lab Results  Component Value Date   NA 138 09/30/2017   K 4.1 09/30/2017   CO2 30 09/30/2017   GLUCOSE 97 09/30/2017   BUN 17 09/30/2017   CREATININE 0.76 09/30/2017   BILITOT 0.5 09/30/2017   ALKPHOS 66 09/30/2017   AST 18 09/30/2017   ALT 21 09/30/2017   PROT 6.7 09/30/2017   ALBUMIN 4.4 09/30/2017   CALCIUM 10.4 09/30/2017   ANIONGAP 13 04/27/2014   GFR 78.40 09/30/2017   Lab Results  Component Value Date   CHOL 216 (H) 09/30/2017   Lab Results  Component Value Date   HDL 49.00 09/30/2017   Lab Results  Component Value Date   LDLCALC 138 (H) 09/30/2017   Lab Results  Component Value Date   TRIG 144.0 09/30/2017   Lab Results  Component Value Date   CHOLHDL 4 09/30/2017   No results found for: HGBA1C    Assessment & Plan:   Problem List Items Addressed This Visit    None    Visit Diagnoses    Physical exam    -  Primary   Relevant Orders   Basic metabolic panel   Lipid panel   CBC with Differential/Platelet   TSH   Hepatic function panel   Hep C Antibody   Need for influenza vaccination       Relevant Orders   Flu Vaccine QUAD High Dose(Fluad) (Completed)    -Check hepatitis C antibody -Check other screening labs -Flu vaccine given -Discussed Shingrix but she declines -She will consider setting up mammogram later this year -Suggest that she start back Flonase 1 to 2 sprays per nostril once daily as needed in addition to her antihistamine.  If does not providing adequate relief  consider addition of Singulair.  Meds ordered this encounter  Medications  . lisinopril-hydrochlorothiazide (ZESTORETIC) 20-12.5 MG tablet    Sig: Take 1 tablet by mouth daily.    Dispense:  90 tablet    Refill:  3    Follow-up: No follow-ups on file.    Evelena Peat, MD

## 2020-06-21 LAB — CBC WITH DIFFERENTIAL/PLATELET
Absolute Monocytes: 545 cells/uL (ref 200–950)
Basophils Absolute: 39 cells/uL (ref 0–200)
Basophils Relative: 0.7 %
Eosinophils Absolute: 110 cells/uL (ref 15–500)
Eosinophils Relative: 2 %
HCT: 44.2 % (ref 35.0–45.0)
Hemoglobin: 14.8 g/dL (ref 11.7–15.5)
Lymphs Abs: 1821 cells/uL (ref 850–3900)
MCH: 32.5 pg (ref 27.0–33.0)
MCHC: 33.5 g/dL (ref 32.0–36.0)
MCV: 97.1 fL (ref 80.0–100.0)
MPV: 11.1 fL (ref 7.5–12.5)
Monocytes Relative: 9.9 %
Neutro Abs: 2987 cells/uL (ref 1500–7800)
Neutrophils Relative %: 54.3 %
Platelets: 227 10*3/uL (ref 140–400)
RBC: 4.55 10*6/uL (ref 3.80–5.10)
RDW: 12.8 % (ref 11.0–15.0)
Total Lymphocyte: 33.1 %
WBC: 5.5 10*3/uL (ref 3.8–10.8)

## 2020-06-21 LAB — HEPATIC FUNCTION PANEL
AG Ratio: 1.9 (calc) (ref 1.0–2.5)
ALT: 16 U/L (ref 6–29)
AST: 19 U/L (ref 10–35)
Albumin: 4.3 g/dL (ref 3.6–5.1)
Alkaline phosphatase (APISO): 59 U/L (ref 37–153)
Bilirubin, Direct: 0.1 mg/dL (ref 0.0–0.2)
Globulin: 2.3 g/dL (calc) (ref 1.9–3.7)
Indirect Bilirubin: 0.7 mg/dL (calc) (ref 0.2–1.2)
Total Bilirubin: 0.8 mg/dL (ref 0.2–1.2)
Total Protein: 6.6 g/dL (ref 6.1–8.1)

## 2020-06-21 LAB — BASIC METABOLIC PANEL
BUN: 14 mg/dL (ref 7–25)
CO2: 25 mmol/L (ref 20–32)
Calcium: 10.3 mg/dL (ref 8.6–10.4)
Chloride: 101 mmol/L (ref 98–110)
Creat: 0.65 mg/dL (ref 0.60–0.93)
Glucose, Bld: 99 mg/dL (ref 65–99)
Potassium: 4.3 mmol/L (ref 3.5–5.3)
Sodium: 139 mmol/L (ref 135–146)

## 2020-06-21 LAB — LIPID PANEL
Cholesterol: 218 mg/dL — ABNORMAL HIGH (ref <200)
HDL: 53 mg/dL (ref >=50)
LDL Cholesterol (Calc): 145 mg/dL (calc) — ABNORMAL HIGH
Non-HDL Cholesterol (Calc): 165 mg/dL (calc) — ABNORMAL HIGH (ref <130)
Total CHOL/HDL Ratio: 4.1 (calc) (ref <5.0)
Triglycerides: 96 mg/dL (ref <150)

## 2020-06-21 LAB — TSH: TSH: 1.82 mIU/L (ref 0.40–4.50)

## 2020-06-21 LAB — HEPATITIS C ANTIBODY
Hepatitis C Ab: NONREACTIVE
SIGNAL TO CUT-OFF: 0 (ref <1.00)

## 2020-07-11 ENCOUNTER — Telehealth: Payer: Self-pay | Admitting: Family Medicine

## 2020-07-11 MED ORDER — MONTELUKAST SODIUM 10 MG PO TABS: 10.0000 mg | ORAL_TABLET | Freq: Every day | ORAL | 3 refills | Status: AC

## 2020-07-11 NOTE — Telephone Encounter (Signed)
Please send in Singulair 10 mg once daily #30 with 3 refills.  Have her be in touch if not improved after 2 weeks

## 2020-07-11 NOTE — Telephone Encounter (Signed)
Patient has taken medication for 3 weeks and states that it is not helping.   Please advise next steps for patient.

## 2020-07-11 NOTE — Telephone Encounter (Signed)
Patient is wanting a call back due to Dr. Caryl Never told her to take OTC medication for allergies for 2 weeks and then let him know how she is doing.  She has taken the medication 3 weeks and it isn't helping.  She states Dr. Caryl Never said he may have to call her in a prescription to help her get better.  Patient is stuffy in her sinuses from the allergies.

## 2020-07-11 NOTE — Telephone Encounter (Signed)
Left detailed message for patient informing of rx to start and also to touch base in 2 weeks. Left number for patient to call back if not improved.

## 2020-11-02 ENCOUNTER — Other Ambulatory Visit: Payer: Self-pay

## 2020-11-02 ENCOUNTER — Encounter: Payer: Self-pay | Admitting: Family Medicine

## 2020-11-02 ENCOUNTER — Ambulatory Visit: Payer: Medicare PPO | Admitting: Family Medicine

## 2020-11-02 VITALS — BP 128/82 | HR 84 | Temp 98.1°F | Wt 161.0 lb

## 2020-11-02 DIAGNOSIS — R300 Dysuria: Secondary | ICD-10-CM

## 2020-11-02 DIAGNOSIS — N3000 Acute cystitis without hematuria: Secondary | ICD-10-CM

## 2020-11-02 LAB — POC URINALSYSI DIPSTICK (AUTOMATED)
Bilirubin, UA: NEGATIVE
Blood, UA: NEGATIVE
Glucose, UA: NEGATIVE
Ketones, UA: NEGATIVE
Nitrite, UA: POSITIVE
Protein, UA: NEGATIVE
Spec Grav, UA: 1.015 (ref 1.010–1.025)
Urobilinogen, UA: 1 E.U./dL
pH, UA: 6.5 (ref 5.0–8.0)

## 2020-11-02 MED ORDER — NITROFURANTOIN MONOHYD MACRO 100 MG PO CAPS: 100.0000 mg | ORAL_CAPSULE | Freq: Two times a day (BID) | ORAL | 0 refills | Status: AC

## 2020-11-02 NOTE — Addendum Note (Signed)
Addended by: Evert Kohl D on: 11/02/2020 12:48 PM   Modules accepted: Orders

## 2020-11-02 NOTE — Progress Notes (Signed)
Subjective:    Patient ID: [name], female    DOB: 20-Jan-1941, 80 y.o.   MRN: [id number]  No chief complaint on file.   HPI Patient is an 80 year old female with past medical history significant for HTN, HLD, osteoporosis and urinary incontinence pulmonology who is followed by Dr. Caryl Never and seen for acute concern.  Patient endorses, dysuria, and pressure starting yesterday.  Patient took an OTC test which was positive for urinary tract infection.  Patient started taking Azo and increasing intake of water.  Typically tries to drink 3 bottles of water per day and coffee.  Denies back pain, nausea, vomiting, fever, chills.  Past Medical History:  Diagnosis Date  . Arthritis   . Cataract   . Hypertension   . Osteoporosis     Allergies  Allergen Reactions  . Alendronate Sodium     REACTION: constipation  . Aspirin   . Celecoxib     REACTION: GI upset  . Penicillins     REACTION: serious dehydration, hospitalized in 1990    ROS General: Denies fever, chills, night sweats, changes in weight, changes in appetite HEENT: Denies headaches, ear pain, changes in vision, rhinorrhea, sore throat CV: Denies CP, palpitations, SOB, orthopnea Pulm: Denies SOB, cough, wheezing GI: Denies abdominal pain, nausea, vomiting, diarrhea, constipation GU: Denies hematuria, frequency, vaginal discharge  + dysuria, bladder pressure Msk: Denies muscle cramps, joint pains Neuro: Denies weakness, numbness, tingling Skin: Denies rashes, bruising Psych: Denies depression, anxiety, hallucinations     Objective:    Blood pressure 128/82, pulse 84, temperature 98.1 F (36.7 C), temperature source Oral, weight 161 lb (73 kg), SpO2 96 %.  Gen. Pleasant, well-nourished, in no distress, normal affect  HEENT: [place]/AT, face symmetric, conjunctiva clear, no scleral icterus, PERRLA, EOMI, nares patent without drainage Lungs: no accessory muscle use Cardiovascular: RRR, no peripheral  edema Musculoskeletal: No deformities, no cyanosis or clubbing, normal tone Neuro:  A&Ox3, CN II-XII intact, normal gait Skin:  Warm, no lesions/ rash   Wt Readings from Last 3 Encounters:  06/20/20 159 lb 4.8 oz (72.3 kg)  04/20/18 160 lb (72.6 kg)  01/19/18 160 lb 9.6 oz (72.8 kg)    Lab Results  Component Value Date   WBC 5.5 06/20/2020   HGB 14.8 06/20/2020   HCT 44.2 06/20/2020   PLT 227 06/20/2020   GLUCOSE 99 06/20/2020   CHOL 218 (H) 06/20/2020   TRIG 96 06/20/2020   HDL 53 06/20/2020   LDLDIRECT 147.6 08/10/2013   LDLCALC 145 (H) 06/20/2020   ALT 16 06/20/2020   AST 19 06/20/2020   NA 139 06/20/2020   K 4.3 06/20/2020   CL 101 06/20/2020   CREATININE 0.65 06/20/2020   BUN 14 06/20/2020   CO2 25 06/20/2020   TSH 1.82 06/20/2020   INR 1.0 10/07/2008    Assessment/Plan:  Dysuria  - Plan: POCT Urinalysis Dipstick (Automated), Urine Culture  Acute cystitis without hematuria  -UA with leuks and nitrites -will send in UCx -start Macrobid -advised to increase po intake of water and fluids -given handout - Plan: nitrofurantoin, macrocrystal-monohydrate, (MACROBID) 100 MG capsule  F/u prn  Abbe Amsterdam, MD

## 2020-11-02 NOTE — Patient Instructions (Signed)
Urinary Tract Infection, Adult A urinary tract infection (UTI) is an infection of any part of the urinary tract. The urinary tract includes:  The kidneys.  The ureters.  The bladder.  The urethra. These organs make, store, and get rid of pee (urine) in the body. What are the causes? This infection is caused by germs (bacteria) in your genital area. These germs grow and cause swelling (inflammation) of your urinary tract. What increases the risk? The following factors may make you more likely to develop this condition:  Using a small, thin tube (catheter) to drain pee.  Not being able to control when you pee or poop (incontinence).  Being female. If you are female, these things can increase the risk: ? Using these methods to prevent pregnancy:  A medicine that kills sperm (spermicide).  A device that blocks sperm (diaphragm). ? Having low levels of a female hormone (estrogen). ? Being pregnant. You are more likely to develop this condition if:  You have genes that add to your risk.  You are sexually active.  You take antibiotic medicines.  You have trouble peeing because of: ? A prostate that is bigger than normal, if you are female. ? A blockage in the part of your body that drains pee from the bladder. ? A kidney stone. ? A nerve condition that affects your bladder. ? Not getting enough to drink. ? Not peeing often enough.  You have other conditions, such as: ? Diabetes. ? A weak disease-fighting system (immune system). ? Sickle cell disease. ? Gout. ? Injury of the spine. What are the signs or symptoms? Symptoms of this condition include:  Needing to pee right away.  Peeing small amounts often.  Pain or burning when peeing.  Blood in the pee.  Pee that smells bad or not like normal.  Trouble peeing.  Pee that is cloudy.  Fluid coming from the vagina, if you are female.  Pain in the belly or lower back. Other symptoms include:  Vomiting.  Not  feeling hungry.  Feeling mixed up (confused). This may be the first symptom in older adults.  Being tired and grouchy (irritable).  A fever.  Watery poop (diarrhea). How is this treated?  Taking antibiotic medicine.  Taking other medicines.  Drinking enough water. In some cases, you may need to see a specialist. Follow these instructions at home: Medicines  Take over-the-counter and prescription medicines only as told by your doctor.  If you were prescribed an antibiotic medicine, take it as told by your doctor. Do not stop taking it even if you start to feel better. General instructions  Make sure you: ? Pee until your bladder is empty. ? Do not hold pee for a long time. ? Empty your bladder after sex. ? Wipe from front to back after peeing or pooping if you are a female. Use each tissue one time when you wipe.  Drink enough fluid to keep your pee pale yellow.  Keep all follow-up visits.   Contact a doctor if:  You do not get better after 1-2 days.  Your symptoms go away and then come back. Get help right away if:  You have very bad back pain.  You have very bad pain in your lower belly.  You have a fever.  You have chills.  You feeling like you will vomit or you vomit. Summary  A urinary tract infection (UTI) is an infection of any part of the urinary tract.  This condition is caused by   germs in your genital area.  There are many risk factors for a UTI.  Treatment includes antibiotic medicines.  Drink enough fluid to keep your pee pale yellow. This information is not intended to replace advice given to you by your health care provider. Make sure you discuss any questions you have with your health care provider. Document Revised: 04/21/2020 Document Reviewed: 04/21/2020 Elsevier Patient Education  2021 [employer]  

## 2020-11-04 LAB — URINE CULTURE
MICRO NUMBER:: 11520350
SPECIMEN QUALITY:: ADEQUATE

## 2020-12-11 ENCOUNTER — Telehealth: Payer: Self-pay | Admitting: Family Medicine

## 2020-12-11 ENCOUNTER — Other Ambulatory Visit: Payer: Self-pay

## 2020-12-11 ENCOUNTER — Ambulatory Visit: Payer: Medicare PPO | Admitting: Family Medicine

## 2020-12-11 ENCOUNTER — Encounter: Payer: Self-pay | Admitting: Family Medicine

## 2020-12-11 VITALS — BP 124/78 | HR 96 | Temp 98.0°F

## 2020-12-11 DIAGNOSIS — R519 Headache, unspecified: Secondary | ICD-10-CM

## 2020-12-11 DIAGNOSIS — Z711 Person with feared health complaint in whom no diagnosis is made: Secondary | ICD-10-CM

## 2020-12-11 LAB — POCT URINALYSIS DIPSTICK
Bilirubin, UA: NEGATIVE
Blood, UA: NEGATIVE
Glucose, UA: NEGATIVE
Ketones, UA: NEGATIVE
Leukocytes, UA: NEGATIVE
Nitrite, UA: NEGATIVE
Protein, UA: NEGATIVE
Spec Grav, UA: 1.025 (ref 1.010–1.025)
Urobilinogen, UA: NEGATIVE E.U./dL — AB
pH, UA: 6 (ref 5.0–8.0)

## 2020-12-11 NOTE — Progress Notes (Signed)
Subjective:    Patient ID: [name], female    DOB: 1941-06-19, 80 y.o.   MRN: [id number]  Chief Complaint  Patient presents with  . Urinary Frequency    HPI Patient was seen today for acute concern.  Patient endorses getting a HA yesterday afternoon which made her concerned she had a UTI as she had similar symptoms with a previous UTI.  Pt states an at home urine test was positive, so she started Azo and came in to clinic.  Pt drinking mostly coffee, may have three 8 oz of water per day.  Patient denies fever, chills, nausea, vomiting, suprapubic pain, pressure, constipation, back pain, dysuria.  Patient endorses history of urinary incontinence.  Tries to limit water intake.  Stopped drinking fluids by 6 PM.  Past Medical History:  Diagnosis Date  . Arthritis   . Cataract   . Hypertension   . Osteoporosis     Allergies  Allergen Reactions  . Alendronate Sodium     REACTION: constipation  . Aspirin   . Celecoxib     REACTION: GI upset  . Penicillins     REACTION: serious dehydration, hospitalized in 1990    ROS General: Denies fever, chills, night sweats, changes in weight, changes in appetite HEENT: Denies ear pain, changes in vision, rhinorrhea, sore throat + headache CV: Denies CP, palpitations, SOB, orthopnea Pulm: Denies SOB, cough, wheezing GI: Denies abdominal pain, nausea, vomiting, diarrhea, constipation + urinary concern GU: Denies dysuria, hematuria, frequency, vaginal discharge Msk: Denies muscle cramps, joint pains Neuro: Denies weakness, numbness, tingling Skin: Denies rashes, bruising Psych: Denies depression, anxiety, hallucinations     Objective:    Blood pressure 124/78, pulse 96, temperature 98 F (36.7 C), temperature source Oral, SpO2 94 %.  Gen. Pleasant, well-nourished, in no distress, normal affect   HEENT: [place]/AT, face symmetric, conjunctiva clear, no scleral icterus, PERRLA, EOMI, nares patent without drainage Lungs: no accessory muscle  use Cardiovascular: RRR, no peripheral edema Neuro:  A&Ox3, CN II-XII intact, normal gait Skin:  Warm, no lesions/ rash   Wt Readings from Last 3 Encounters:  11/02/20 161 lb (73 kg)  06/20/20 159 lb 4.8 oz (72.3 kg)  04/20/18 160 lb (72.6 kg)    Lab Results  Component Value Date   WBC 5.5 06/20/2020   HGB 14.8 06/20/2020   HCT 44.2 06/20/2020   PLT 227 06/20/2020   GLUCOSE 99 06/20/2020   CHOL 218 (H) 06/20/2020   TRIG 96 06/20/2020   HDL 53 06/20/2020   LDLDIRECT 147.6 08/10/2013   LDLCALC 145 (H) 06/20/2020   ALT 16 06/20/2020   AST 19 06/20/2020   NA 139 06/20/2020   K 4.3 06/20/2020   CL 101 06/20/2020   CREATININE 0.65 06/20/2020   BUN 14 06/20/2020   CO2 25 06/20/2020   TSH 1.82 06/20/2020   INR 1.0 10/07/2008    Assessment/Plan:  Concern about urinary tract disease without diagnosis  -UA with SG 1.025, and urine finding color likely 2/2 Azo intake, otherwise normal -No acute concern for UTI -Patient encouraged to increase p.o. intake of water daily -Continue to monitor - Plan: POCT urinalysis dipstick  Acute nonintractable headache, unspecified headache type -Resolved -Patient encouraged to increase p.o. intake of water and fluids.  F/u with PCP as needed for continued or worsening symptoms  Abbe Amsterdam, MD

## 2021-01-09 NOTE — Telephone Encounter (Signed)
error 

## 2021-01-22 DIAGNOSIS — H04123 Dry eye syndrome of bilateral lacrimal glands: Secondary | ICD-10-CM | POA: Diagnosis not present

## 2021-02-09 ENCOUNTER — Telehealth: Payer: Self-pay | Admitting: Family Medicine

## 2021-02-09 NOTE — Telephone Encounter (Signed)
error 

## 2021-02-20 ENCOUNTER — Other Ambulatory Visit: Payer: Self-pay

## 2021-02-20 ENCOUNTER — Ambulatory Visit: Payer: Medicare PPO

## 2021-02-20 DIAGNOSIS — H04123 Dry eye syndrome of bilateral lacrimal glands: Secondary | ICD-10-CM | POA: Diagnosis not present

## 2021-02-27 ENCOUNTER — Ambulatory Visit (INDEPENDENT_AMBULATORY_CARE_PROVIDER_SITE_OTHER): Payer: Medicare PPO

## 2021-02-27 ENCOUNTER — Other Ambulatory Visit: Payer: Self-pay

## 2021-02-27 DIAGNOSIS — Z0000 Encounter for general adult medical examination without abnormal findings: Secondary | ICD-10-CM

## 2021-02-27 NOTE — Patient Instructions (Signed)
[name] , Thank you for taking time to come for your Medicare Wellness Visit. I appreciate your ongoing commitment to your health goals. Please review the following plan we discussed and let me know if I can assist you in the future.   Screening recommendations/referrals: Colonoscopy: No longer required  Mammogram: Done 08/08/15 Bone Density: Done 08/28/16 Recommended yearly ophthalmology/optometry visit for glaucoma screening and checkup Recommended yearly dental visit for hygiene and checkup  Vaccinations: Influenza vaccine: Up to date Pneumococcal vaccine: Up to date Tdap vaccine: Up to date Shingles vaccine: Shingrix discussed. Please contact your pharmacy for coverage information.    Covid-19:Completed 10/16/19 & 11/13/19  Advanced directives: Please bring a copy of your health care power of attorney and living will to the office at your convenience.  Conditions/risks identified: none at this time  Next appointment: Follow up in one year for your annual wellness visit    Preventive Care 65 Years and Older, Female Preventive care refers to lifestyle choices and visits with your health care provider that can promote health and wellness. What does preventive care include?  A yearly physical exam. This is also called an annual well check.  Dental exams once or twice a year.  Routine eye exams. Ask your health care provider how often you should have your eyes checked.  Personal lifestyle choices, including:  Daily care of your teeth and gums.  Regular physical activity.  Eating a healthy diet.  Avoiding tobacco and drug use.  Limiting alcohol use.  Practicing safe sex.  Taking low-dose aspirin every day.  Taking vitamin and mineral supplements as recommended by your health care provider. What happens during an annual well check? The services and screenings done by your health care provider during your annual well check will depend on your age, overall health,  lifestyle risk factors, and family history of disease. Counseling  Your health care provider may ask you questions about your:  Alcohol use.  Tobacco use.  Drug use.  Emotional well-being.  Home and relationship well-being.  Sexual activity.  Eating habits.  History of falls.  Memory and ability to understand (cognition).  Work and work Astronomer.  Reproductive health. Screening  You may have the following tests or measurements:  Height, weight, and BMI.  Blood pressure.  Lipid and cholesterol levels. These may be checked every 5 years, or more frequently if you are over 91 years old.  Skin check.  Lung cancer screening. You may have this screening every year starting at age 16 if you have a 30-pack-year history of smoking and currently smoke or have quit within the past 15 years.  Fecal occult blood test (FOBT) of the stool. You may have this test every year starting at age 11.  Flexible sigmoidoscopy or colonoscopy. You may have a sigmoidoscopy every 5 years or a colonoscopy every 10 years starting at age 73.  Hepatitis C blood test.  Hepatitis B blood test.  Sexually transmitted disease (STD) testing.  Diabetes screening. This is done by checking your blood sugar (glucose) after you have not eaten for a while (fasting). You may have this done every 1-3 years.  Bone density scan. This is done to screen for osteoporosis. You may have this done starting at age 3.  Mammogram. This may be done every 1-2 years. Talk to your health care provider about how often you should have regular mammograms. Talk with your health care provider about your test results, treatment options, and if necessary, the need for more  tests. Vaccines  Your health care provider may recommend certain vaccines, such as:  Influenza vaccine. This is recommended every year.  Tetanus, diphtheria, and acellular pertussis (Tdap, Td) vaccine. You may need a Td booster every 10 years.  Zoster  vaccine. You may need this after age 82.  Pneumococcal 13-valent conjugate (PCV13) vaccine. One dose is recommended after age 26.  Pneumococcal polysaccharide (PPSV23) vaccine. One dose is recommended after age 35. Talk to your health care provider about which screenings and vaccines you need and how often you need them. This information is not intended to replace advice given to you by your health care provider. Make sure you discuss any questions you have with your health care provider. Document Released: 10/06/2015 Document Revised: 05/29/2016 Document Reviewed: 07/11/2015 Elsevier Interactive Patient Education  2017 [place] Prevention in the Home Falls can cause injuries. They can happen to people of all ages. There are many things you can do to make your home safe and to help prevent falls. What can I do on the outside of my home?  Regularly fix the edges of walkways and driveways and fix any cracks.  Remove anything that might make you trip as you walk through a door, such as a raised step or threshold.  Trim any bushes or trees on the path to your home.  Use bright outdoor lighting.  Clear any walking paths of anything that might make someone trip, such as rocks or tools.  Regularly check to see if handrails are loose or broken. Make sure that both sides of any steps have handrails.  Any raised decks and porches should have guardrails on the edges.  Have any leaves, snow, or ice cleared regularly.  Use sand or salt on walking paths during winter.  Clean up any spills in your garage right away. This includes oil or grease spills. What can I do in the bathroom?  Use night lights.  Install grab bars by the toilet and in the tub and shower. Do not use towel bars as grab bars.  Use non-skid mats or decals in the tub or shower.  If you need to sit down in the shower, use a plastic, non-slip stool.  Keep the floor dry. Clean up any water that spills on the  floor as soon as it happens.  Remove soap buildup in the tub or shower regularly.  Attach bath mats securely with double-sided non-slip rug tape.  Do not have throw rugs and other things on the floor that can make you trip. What can I do in the bedroom?  Use night lights.  Make sure that you have a light by your bed that is easy to reach.  Do not use any sheets or blankets that are too big for your bed. They should not hang down onto the floor.  Have a firm chair that has side arms. You can use this for support while you get dressed.  Do not have throw rugs and other things on the floor that can make you trip. What can I do in the kitchen?  Clean up any spills right away.  Avoid walking on wet floors.  Keep items that you use a lot in easy-to-reach places.  If you need to reach something above you, use a strong step stool that has a grab bar.  Keep electrical cords out of the way.  Do not use floor polish or wax that makes floors slippery. If you must use wax, use non-skid floor  wax.  Do not have throw rugs and other things on the floor that can make you trip. What can I do with my stairs?  Do not leave any items on the stairs.  Make sure that there are handrails on both sides of the stairs and use them. Fix handrails that are broken or loose. Make sure that handrails are as long as the stairways.  Check any carpeting to make sure that it is firmly attached to the stairs. Fix any carpet that is loose or worn.  Avoid having throw rugs at the top or bottom of the stairs. If you do have throw rugs, attach them to the floor with carpet tape.  Make sure that you have a light switch at the top of the stairs and the bottom of the stairs. If you do not have them, ask someone to add them for you. What else can I do to help prevent falls?  Wear shoes that:  Do not have high heels.  Have rubber bottoms.  Are comfortable and fit you well.  Are closed at the toe. Do not wear  sandals.  If you use a stepladder:  Make sure that it is fully opened. Do not climb a closed stepladder.  Make sure that both sides of the stepladder are locked into place.  Ask someone to hold it for you, if possible.  Clearly mark and make sure that you can see:  Any grab bars or handrails.  First and last steps.  Where the edge of each step is.  Use tools that help you move around (mobility aids) if they are needed. These include:  Canes.  Walkers.  Scooters.  Crutches.  Turn on the lights when you go into a dark area. Replace any light bulbs as soon as they burn out.  Set up your furniture so you have a clear path. Avoid moving your furniture around.  If any of your floors are uneven, fix them.  If there are any pets around you, be aware of where they are.  Review your medicines with your doctor. Some medicines can make you feel dizzy. This can increase your chance of falling. Ask your doctor what other things that you can do to help prevent falls. This information is not intended to replace advice given to you by your health care provider. Make sure you discuss any questions you have with your health care provider. Document Released: 07/06/2009 Document Revised: 02/15/2016 Document Reviewed: 10/14/2014 Elsevier Interactive Patient Education  2017 [employer].

## 2021-02-27 NOTE — Progress Notes (Signed)
Virtual Visit via Telephone Note  I connected with  [name] on 02/27/21 at  2:30 PM EDT by telephone and verified that I am speaking with the correct person using two identifiers.  Medicare Annual Wellness visit completed telephonically due to Covid-19 pandemic.   Persons participating in this call: This Health Coach and this patient.   Location: Patient: Home Provider: Office   I discussed the limitations, risks, security and privacy concerns of performing an evaluation and management service by telephone and the availability of in person appointments. The patient expressed understanding and agreed to proceed.  Unable to perform video visit due to video visit attempted and failed and/or patient does not have video capability.   Some vital signs may be absent or patient reported.   Andrea Schlein, LPN    Subjective:   [name] is a 80 y.o. female who presents for Medicare Annual (Subsequent) preventive examination.  Review of Systems     Cardiac Risk Factors include: advanced age (>58men, >1 women)     Objective:    There were no vitals filed for this visit. There is no height or weight on file to calculate BMI.  Advanced Directives 02/27/2021 11/15/2019 11/26/2016 10/01/2016  Does Patient Have a Medical Advance Directive? Yes Yes Yes Yes  Type of Advance Directive Living will Healthcare Power of [place];Living will - -  Does patient want to make changes to medical advance directive? - No - Patient declined - -  Copy of Healthcare Power of Attorney in Chart? - No - copy requested - -    Current Medications (verified) Outpatient Encounter Medications as of 02/27/2021  Medication Sig  . acetaminophen (TYLENOL) 650 MG CR tablet Take 1,300 mg by mouth every 8 (eight) hours as needed (For arthritis pain.).  Marland Kitchen lisinopril-hydrochlorothiazide (ZESTORETIC) 20-12.5 MG tablet Take 1 tablet by mouth daily.  . meclizine (ANTIVERT) 25 MG tablet Take 25 mg by mouth 3 (three)  times daily as needed. (Patient not taking: Reported on 02/27/2021)  . montelukast (SINGULAIR) 10 MG tablet Take 1 tablet (10 mg total) by mouth at bedtime. (Patient not taking: Reported on 02/27/2021)   No facility-administered encounter medications on file as of 02/27/2021.    Allergies (verified) Alendronate sodium, Aspirin, Celecoxib, and Penicillins   History: Past Medical History:  Diagnosis Date  . Arthritis   . Cataract   . Hypertension   . Osteoporosis    Past Surgical History:  Procedure Laterality Date  . BUNIONECTOMY    . CATARACTS    . COLONOSCOPY  06/2002  . JOINT REPLACEMENT  2009, 2010   knee replacements  . NASAL SINUS SURGERY    . VOCAL CORD POLYPS     REMOVED   Family History  Problem Relation Age of Onset  . Colon cancer Sister 31  . Cancer Father        prostate  . Arthritis Father    Social History   Socioeconomic History  . Marital status: Married    Spouse name: Not on file  . Number of children: Not on file  . Years of education: Not on file  . Highest education level: Not on file  Occupational History  . Not on file  Tobacco Use  . Smoking status: Former Smoker    Packs/day: 0.50    Years: 3.00    Pack years: 1.50    Quit date: 11/08/1987    Years since quitting: 33.3  . Smokeless tobacco: Never Used  Vaping  Use  . Vaping Use: Never used  Substance and Sexual Activity  . Alcohol use: No  . Drug use: No  . Sexual activity: Not on file  Other Topics Concern  . Not on file  Social History Narrative  . Not on file   Social Determinants of Health   Financial Resource Strain: Low Risk   . Difficulty of Paying Living Expenses: Not hard at all  Food Insecurity: No Food Insecurity  . Worried About Programme researcher, broadcasting/film/video in the Last Year: Never true  . Ran Out of Food in the Last Year: Never true  Transportation Needs: No Transportation Needs  . Lack of Transportation (Medical): No  . Lack of Transportation (Non-Medical): No  Physical  Activity: Inactive  . Days of Exercise per Week: 0 days  . Minutes of Exercise per Session: 0 min  Stress: No Stress Concern Present  . Feeling of Stress : Not at all  Social Connections: Moderately Isolated  . Frequency of Communication with Friends and Family: More than three times a week  . Frequency of Social Gatherings with Friends and Family: More than three times a week  . Attends Religious Services: More than 4 times per year  . Active Member of Clubs or Organizations: No  . Attends Banker Meetings: Never  . Marital Status: Widowed    Tobacco Counseling Counseling given: Not Answered   Clinical Intake:  Pre-visit preparation completed: Yes  Pain : No/denies pain     BMI - recorded: 36.1 Nutritional Status: BMI > 30  Obese Nutritional Risks: None Diabetes: No  How often do you need to have someone help you when you read instructions, pamphlets, or other written materials from your doctor or pharmacy?: 1 - Never  Diabetic?No  Interpreter Needed?: No  Information entered by :: Lanier Ensign, LPN   Activities of Daily Living In your present state of health, do you have any difficulty performing the following activities: 02/27/2021  Hearing? N  Vision? N  Difficulty concentrating or making decisions? N  Walking or climbing stairs? N  Dressing or bathing? N  Doing errands, shopping? N  Preparing Food and eating ? N  Using the Toilet? Y  Comment weras a light pad  In the past six months, have you accidently leaked urine? Y  Do you have problems with loss of bowel control? N  Managing your Medications? N  Managing your Finances? N  Some recent data might be hidden    Patient Care Team: Kristian Covey, MD as PCP - General  Indicate any recent Medical Services you may have received from other than [facility] providers in the past year (date may be approximate).     Assessment:   This is a routine wellness examination for  [name].  Hearing/Vision screen  Hearing Screening   125Hz  250Hz  500Hz  1000Hz  2000Hz  3000Hz  4000Hz  6000Hz  8000Hz   Right ear:           Left ear:           Comments: Pt states no hearing issues   Vision Screening Comments: Pt follows up with [place] ophthalmology for annual exams    Dietary issues and exercise activities discussed: Current Exercise Habits: The patient does not participate in regular exercise at present (works around house)  Goals Addressed            This Visit's Progress   . Patient Stated       None at this time  Depression Screen PHQ 2/9 Scores 02/27/2021 06/20/2020 11/15/2019 07/29/2016 08/10/2014 08/10/2013  PHQ - 2 Score 0 0 0 0 0 0  PHQ- 9 Score - 0 - - - -    Fall Risk Fall Risk  02/27/2021 06/20/2020 11/15/2019 07/29/2016 08/10/2014  Falls in the past year? 0 0 0 No No  Number falls in past yr: 0 - 0 - -  Injury with Fall? 0 - - - -  Risk for fall due to : Impaired vision - - - -  Follow up Falls prevention discussed - Education provided;Falls prevention discussed - -    FALL RISK PREVENTION PERTAINING TO THE HOME:  Any stairs in or around the home? Yes  If so, are there any without handrails? No  Home free of loose throw rugs in walkways, pet beds, electrical cords, etc? Yes  Adequate lighting in your home to reduce risk of falls? Yes   ASSISTIVE DEVICES UTILIZED TO PREVENT FALLS:  Life alert? No  Use of a cane, walker or w/c? No  Grab bars in the bathroom? Yes  Shower chair or bench in shower? Yes  Elevated toilet seat or a handicapped toilet? No   TIMED UP AND GO:  Was the test performed? No .   Cognitive Function:     6CIT Screen 02/27/2021  What Year? 0 points  What month? 0 points  What time? 0 points  Count back from 20 0 points  Months in reverse 0 points  Repeat phrase 0 points  Total Score 0    Immunizations Immunization History  Administered Date(s) Administered  . Fluad Quad(high Dose 65+) 06/20/2020  .  Influenza Split 07/17/2011, 06/04/2012  . Influenza Whole 08/02/2009, 06/14/2010  . Influenza, High Dose Seasonal PF 07/07/2013, 07/11/2015, 07/29/2016, 06/19/2017  . Influenza,inj,Quad PF,6+ Mos 05/26/2014, 06/29/2018  . Influenza,inj,quad, With Preservative 07/02/2019  . Influenza-Unspecified 07/02/2019  . Moderna Sars-Covid-2 Vaccination 10/16/2019, 11/13/2019  . Pneumococcal Conjugate-13 07/11/2015  . Pneumococcal Polysaccharide-23 09/23/2006  . Td 09/23/2006, 06/19/2017  . Zoster, Live 06/04/2012    TDAP status: Up to date  Flu Vaccine status: Up to date  Pneumococcal vaccine status: Up to date  Covid-19 vaccine status: Completed vaccines  Qualifies for Shingles Vaccine? Yes   Zostavax completed Yes   Shingrix Completed?: No.    Education has been provided regarding the importance of this vaccine. Patient has been advised to call insurance company to determine out of pocket expense if they have not yet received this vaccine. Advised may also receive vaccine at local pharmacy or Health Dept. Verbalized acceptance and understanding.  Screening Tests Health Maintenance  Topic Date Due  . Zoster Vaccines- Shingrix (1 of 2) Never done  . COVID-19 Vaccine (3 - Booster for Moderna series) 04/11/2020  . INFLUENZA VACCINE  04/23/2021  . TETANUS/TDAP  06/20/2027  . DEXA SCAN  Completed  . PNA vac Low Risk Adult  Completed  . Pneumococcal Vaccine 40-81 Years old  Aged Out  . HPV VACCINES  Aged Out    Health Maintenance  Health Maintenance Due  Topic Date Due  . Zoster Vaccines- Shingrix (1 of 2) Never done  . COVID-19 Vaccine (3 - Booster for Moderna series) 04/11/2020    Colorectal cancer screening: No longer required.   Mammogram status: Completed 08/08/15. Repeat every year  Bone Density status: Completed 08/28/16. Results reflect: Bone density results: OSTEOPOROSIS. Repeat every 3-5 years.  Additional Screening:   Vision Screening: Recommended annual ophthalmology  exams for early detection of glaucoma  and other disorders of the eye. Is the patient up to date with their annual eye exam?  Yes  Who is the provider or what is the name of the office in which the patient attends annual eye exams?  opthamology If pt is not established with a provider, would they like to be referred to a provider to establish care? No .   Dental Screening: Recommended annual dental exams for proper oral hygiene  Community Resource Referral / Chronic Care Management: CRR required this visit?  No   CCM required this visit?  No      Plan:     I have personally reviewed and noted the following in the patient's chart:   . Medical and social history . Use of alcohol, tobacco or illicit drugs  . Current medications and supplements including opioid prescriptions.  . Functional ability and status . Nutritional status . Physical activity . Advanced directives . List of other physicians . Hospitalizations, surgeries, and ER visits in previous 12 months . Vitals . Screenings to include cognitive, depression, and falls . Referrals and appointments  In addition, I have reviewed and discussed with patient certain preventive protocols, quality metrics, and best practice recommendations. A written personalized care plan for preventive services as well as general preventive health recommendations were provided to patient.     Andrea Schlein, LPN   0/0/1749   Nurse Notes: None

## 2021-04-16 DIAGNOSIS — M25531 Pain in right wrist: Secondary | ICD-10-CM | POA: Diagnosis not present

## 2021-07-11 ENCOUNTER — Encounter: Payer: Self-pay | Admitting: Family Medicine

## 2021-07-11 ENCOUNTER — Other Ambulatory Visit: Payer: Self-pay

## 2021-07-11 ENCOUNTER — Ambulatory Visit (INDEPENDENT_AMBULATORY_CARE_PROVIDER_SITE_OTHER): Payer: Medicare PPO

## 2021-07-11 ENCOUNTER — Ambulatory Visit: Payer: Medicare PPO | Admitting: Family Medicine

## 2021-07-11 VITALS — BP 124/70 | HR 72 | Temp 97.5°F | Wt 161.7 lb

## 2021-07-11 DIAGNOSIS — R0609 Other forms of dyspnea: Secondary | ICD-10-CM

## 2021-07-11 DIAGNOSIS — I10 Essential (primary) hypertension: Secondary | ICD-10-CM

## 2021-07-11 DIAGNOSIS — Z23 Encounter for immunization: Secondary | ICD-10-CM | POA: Diagnosis not present

## 2021-07-11 DIAGNOSIS — R0600 Dyspnea, unspecified: Secondary | ICD-10-CM | POA: Diagnosis not present

## 2021-07-11 LAB — CBC WITH DIFFERENTIAL/PLATELET
Basophils Absolute: 0.1 10*3/uL (ref 0.0–0.1)
Basophils Relative: 1.3 % (ref 0.0–3.0)
Eosinophils Absolute: 0.1 10*3/uL (ref 0.0–0.7)
Eosinophils Relative: 2 % (ref 0.0–5.0)
HCT: 41.8 % (ref 36.0–46.0)
Hemoglobin: 13.9 g/dL (ref 12.0–15.0)
Lymphocytes Relative: 38.5 % (ref 12.0–46.0)
Lymphs Abs: 1.7 10*3/uL (ref 0.7–4.0)
MCHC: 33.3 g/dL (ref 30.0–36.0)
MCV: 99.1 fl (ref 78.0–100.0)
Monocytes Absolute: 0.5 10*3/uL (ref 0.1–1.0)
Monocytes Relative: 10.9 % (ref 3.0–12.0)
Neutro Abs: 2 10*3/uL (ref 1.4–7.7)
Neutrophils Relative %: 47.3 % (ref 43.0–77.0)
Platelets: 227 10*3/uL (ref 150.0–400.0)
RBC: 4.22 Mil/uL (ref 3.87–5.11)
RDW: 13.6 % (ref 11.5–15.5)
WBC: 4.3 10*3/uL (ref 4.0–10.5)

## 2021-07-11 LAB — BASIC METABOLIC PANEL
BUN: 12 mg/dL (ref 6–23)
CO2: 29 mEq/L (ref 19–32)
Calcium: 9.9 mg/dL (ref 8.4–10.5)
Chloride: 100 mEq/L (ref 96–112)
Creatinine, Ser: 0.68 mg/dL (ref 0.40–1.20)
GFR: 81.96 mL/min (ref >60.00)
Glucose, Bld: 103 mg/dL — ABNORMAL HIGH (ref 70–99)
Potassium: 3.5 mEq/L (ref 3.5–5.1)
Sodium: 137 mEq/L (ref 135–145)

## 2021-07-11 LAB — BRAIN NATRIURETIC PEPTIDE: Pro B Natriuretic peptide (BNP): 9 pg/mL (ref 0.0–100.0)

## 2021-07-11 NOTE — Addendum Note (Signed)
Addended by: Kandra Nicolas on: 07/11/2021 08:37 AM   Modules accepted: Orders

## 2021-07-11 NOTE — Progress Notes (Signed)
Established Patient Office Visit  Subjective:  Patient ID: [name], female    DOB: 09-23-1941  Age: 80 y.o. MRN: [id number]  CC:  Chief Complaint  Patient presents with   Annual Exam    HPI [name] presents for medical follow-up.  She has history of hypertension and seasonal allergies.  She remains on lisinopril HCTZ.  Blood pressures been well controlled.  No recent dizziness.  She has had some dyspnea with walking over the past year or so.  She thinks this may be deconditioning.  She is been very sedentary.  Never had any chest pain.  No peripheral edema issues.  No orthopnea.  Does have a history of smoking.  Quit 1989 but only about 10-pack-year history.  No recent cough or wheeze.  No history of anemia.  No recent falls.  Past Medical History:  Diagnosis Date   Arthritis    Cataract    Hypertension    Osteoporosis     Past Surgical History:  Procedure Laterality Date   BUNIONECTOMY     CATARACTS     COLONOSCOPY  06/2002   JOINT REPLACEMENT  2009, 2010   knee replacements   NASAL SINUS SURGERY     VOCAL CORD POLYPS     REMOVED    Family History  Problem Relation Age of Onset   Colon cancer Sister 51   Cancer Father        prostate   Arthritis Father     Social History   Socioeconomic History   Marital status: Married    Spouse name: Not on file   Number of children: Not on file   Years of education: Not on file   Highest education level: Not on file  Occupational History   Not on file  Tobacco Use   Smoking status: Former    Packs/day: 0.50    Years: 3.00    Pack years: 1.50    Types: Cigarettes    Quit date: 11/08/1987    Years since quitting: 33.6   Smokeless tobacco: Never  Vaping Use   Vaping Use: Never used  Substance and Sexual Activity   Alcohol use: No   Drug use: No   Sexual activity: Not on file  Other Topics Concern   Not on file  Social History Narrative   Not on file   Social Determinants of Health    Financial Resource Strain: Low Risk    Difficulty of Paying Living Expenses: Not hard at all  Food Insecurity: No Food Insecurity   Worried About Programme researcher, broadcasting/film/video in the Last Year: Never true   Ran Out of Food in the Last Year: Never true  Transportation Needs: No Transportation Needs   Lack of Transportation (Medical): No   Lack of Transportation (Non-Medical): No  Physical Activity: Inactive   Days of Exercise per Week: 0 days   Minutes of Exercise per Session: 0 min  Stress: No Stress Concern Present   Feeling of Stress : Not at all  Social Connections: Moderately Isolated   Frequency of Communication with Friends and Family: More than three times a week   Frequency of Social Gatherings with Friends and Family: More than three times a week   Attends Religious Services: More than 4 times per year   Active Member of [employer] or Organizations: No   Attends Banker Meetings: Never   Marital Status: Widowed  Intimate Partner Violence: Not At Risk   Fear of  Current or Ex-Partner: No   Emotionally Abused: No   Physically Abused: No   Sexually Abused: No    Outpatient Medications Prior to Visit  Medication Sig Dispense Refill   acetaminophen (TYLENOL) 650 MG CR tablet Take 1,300 mg by mouth every 8 (eight) hours as needed (For arthritis pain.).     lisinopril-hydrochlorothiazide (ZESTORETIC) 20-12.5 MG tablet Take 1 tablet by mouth daily. 90 tablet 3   meclizine (ANTIVERT) 25 MG tablet Take 25 mg by mouth 3 (three) times daily as needed.     montelukast (SINGULAIR) 10 MG tablet Take 1 tablet (10 mg total) by mouth at bedtime. 30 tablet 3   No facility-administered medications prior to visit.    Allergies  Allergen Reactions   Alendronate Sodium     REACTION: constipation   Aspirin    Celecoxib     REACTION: GI upset   Penicillins     REACTION: serious dehydration, hospitalized in 1990    ROS Review of Systems  Constitutional:  Negative for appetite  change, chills, fatigue and fever.  Eyes:  Negative for visual disturbance.  Respiratory:  Positive for shortness of breath. Negative for cough, chest tightness and wheezing.   Cardiovascular:  Negative for chest pain, palpitations and leg swelling.  Neurological:  Negative for dizziness, seizures, syncope, weakness, light-headedness and headaches.     Objective:    Physical Exam Constitutional:      Appearance: She is well-developed.  Eyes:     Pupils: Pupils are equal, round, and reactive to light.  Neck:     Thyroid: No thyromegaly.     Vascular: No JVD.  Cardiovascular:     Rate and Rhythm: Normal rate.     Heart sounds:    No gallop.     Comments: Occasional skipped beat Pulmonary:     Effort: Pulmonary effort is normal. No respiratory distress.     Breath sounds: Normal breath sounds. No wheezing or rales.  Musculoskeletal:     Cervical back: Neck supple.     Right lower leg: No edema.     Left lower leg: No edema.  Neurological:     Mental Status: She is alert.    BP 124/70 (BP Location: Left Arm, Patient Position: Sitting, Cuff Size: Normal)   Pulse 72   Temp (!) 97.5 F (36.4 C) (Oral)   Wt 161 lb 11.2 oz (73.3 kg)   SpO2 94%   BMI 36.25 kg/m  Wt Readings from Last 3 Encounters:  07/11/21 161 lb 11.2 oz (73.3 kg)  11/02/20 161 lb (73 kg)  06/20/20 159 lb 4.8 oz (72.3 kg)     Health Maintenance Due  Topic Date Due   Zoster Vaccines- Shingrix (1 of 2) Never done   COVID-19 Vaccine (3 - Booster for Moderna series) 04/11/2020    There are no preventive care reminders to display for this patient.  Lab Results  Component Value Date   TSH 1.82 06/20/2020   Lab Results  Component Value Date   WBC 5.5 06/20/2020   HGB 14.8 06/20/2020   HCT 44.2 06/20/2020   MCV 97.1 06/20/2020   PLT 227 06/20/2020   Lab Results  Component Value Date   NA 139 06/20/2020   K 4.3 06/20/2020   CO2 25 06/20/2020   GLUCOSE 99 06/20/2020   BUN 14 06/20/2020    CREATININE 0.65 06/20/2020   BILITOT 0.8 06/20/2020   ALKPHOS 66 09/30/2017   AST 19 06/20/2020   ALT 16 06/20/2020  PROT 6.6 06/20/2020   ALBUMIN 4.4 09/30/2017   CALCIUM 10.3 06/20/2020   ANIONGAP 13 04/27/2014   GFR 78.40 09/30/2017   Lab Results  Component Value Date   CHOL 218 (H) 06/20/2020   Lab Results  Component Value Date   HDL 53 06/20/2020   Lab Results  Component Value Date   LDLCALC 145 (H) 06/20/2020   Lab Results  Component Value Date   TRIG 96 06/20/2020   Lab Results  Component Value Date   CHOLHDL 4.1 06/20/2020   No results found for: HGBA1C    Assessment & Plan:   Problem List Items Addressed This Visit       Unprioritized   Hypertension   Relevant Orders   Basic metabolic panel   Other Visit Diagnoses     Exertional dyspnea    -  Primary   Relevant Orders   DG Chest 2 View   CBC with Differential/Platelet   Brain Natriuretic Peptide   Need for immunization against influenza       Relevant Orders   Flu Vaccine QUAD High Dose(Fluad) (Completed)     Patient has hypertension which is stable and well-controlled.  Continue lisinopril HCTZ.  Check basic metabolic panel.  Suspect her dyspnea is secondary to deconditioning.  Nonfocal exam.  She is not any red flags such as orthopnea or peripheral edema.  No history of heart failure. -Check chest x-ray, BNP, CBC -For any progressive symptoms consider echocardiogram and pulmonary function testing -If above normal we have asked that she try to gradually increase her activities  No orders of the defined types were placed in this encounter.   Follow-up: No follow-ups on file.    Evelena Peat, MD

## 2021-08-02 DIAGNOSIS — Z08 Encounter for follow-up examination after completed treatment for malignant neoplasm: Secondary | ICD-10-CM | POA: Diagnosis not present

## 2021-08-02 DIAGNOSIS — L814 Other melanin hyperpigmentation: Secondary | ICD-10-CM | POA: Diagnosis not present

## 2021-08-02 DIAGNOSIS — L718 Other rosacea: Secondary | ICD-10-CM | POA: Diagnosis not present

## 2021-08-02 DIAGNOSIS — L821 Other seborrheic keratosis: Secondary | ICD-10-CM | POA: Diagnosis not present

## 2021-08-02 DIAGNOSIS — I8393 Asymptomatic varicose veins of bilateral lower extremities: Secondary | ICD-10-CM | POA: Diagnosis not present

## 2021-08-02 DIAGNOSIS — Z85828 Personal history of other malignant neoplasm of skin: Secondary | ICD-10-CM | POA: Diagnosis not present

## 2021-08-02 DIAGNOSIS — L570 Actinic keratosis: Secondary | ICD-10-CM | POA: Diagnosis not present

## 2021-08-02 DIAGNOSIS — D225 Melanocytic nevi of trunk: Secondary | ICD-10-CM | POA: Diagnosis not present

## 2021-08-02 DIAGNOSIS — L819 Disorder of pigmentation, unspecified: Secondary | ICD-10-CM | POA: Diagnosis not present

## 2021-08-27 ENCOUNTER — Other Ambulatory Visit: Payer: Self-pay | Admitting: Family Medicine

## 2021-11-26 ENCOUNTER — Other Ambulatory Visit: Payer: Self-pay | Admitting: Family Medicine

## 2021-11-26 DIAGNOSIS — M180 Bilateral primary osteoarthritis of first carpometacarpal joints: Secondary | ICD-10-CM | POA: Diagnosis not present

## 2022-01-07 DIAGNOSIS — J0100 Acute maxillary sinusitis, unspecified: Secondary | ICD-10-CM | POA: Diagnosis not present

## 2022-02-25 ENCOUNTER — Other Ambulatory Visit: Payer: Self-pay | Admitting: Family Medicine

## 2022-03-05 ENCOUNTER — Ambulatory Visit: Payer: Medicare PPO

## 2022-04-01 DIAGNOSIS — L570 Actinic keratosis: Secondary | ICD-10-CM | POA: Diagnosis not present

## 2022-04-01 DIAGNOSIS — D492 Neoplasm of unspecified behavior of bone, soft tissue, and skin: Secondary | ICD-10-CM | POA: Diagnosis not present

## 2022-04-01 DIAGNOSIS — L538 Other specified erythematous conditions: Secondary | ICD-10-CM | POA: Diagnosis not present

## 2022-04-01 DIAGNOSIS — L578 Other skin changes due to chronic exposure to nonionizing radiation: Secondary | ICD-10-CM | POA: Diagnosis not present

## 2022-06-03 ENCOUNTER — Other Ambulatory Visit: Payer: Self-pay | Admitting: Family Medicine

## 2022-06-21 ENCOUNTER — Encounter: Payer: Self-pay | Admitting: Adult Health

## 2022-06-21 ENCOUNTER — Ambulatory Visit: Payer: Medicare PPO | Admitting: Adult Health

## 2022-06-21 VITALS — BP 120/80 | HR 85 | Temp 97.8°F | Ht <= 58 in | Wt 158.0 lb

## 2022-06-21 DIAGNOSIS — K529 Noninfective gastroenteritis and colitis, unspecified: Secondary | ICD-10-CM

## 2022-06-21 NOTE — Progress Notes (Signed)
Subjective:    Patient ID: [name], female    DOB: [birth year], 81 y.o.   MRN: [id number]  HPI 81 year old female who  has a past medical history of Arthritis, Cataract, Hypertension, and Osteoporosis.  She reports that she woke up this morning about 2:30 am with a cramping abdominal pain and when she went to the bathroom and had diarrhea.She had multiple episodes of diarrhea in the early morning hours. Later this morning when she had another episode of diarrhea she did not notice a small amount of bright red blood in her stool. She has not had a bowel movement since   She endorses having nausea without vomiting and took some dramamine that she had at home which improved her nausea.   Last night she ate a lettuce, cheese and tomato sandwich.   She denies UTI like symptoms    Review of Systems See HPI   Past Medical History:  Diagnosis Date   Arthritis    Cataract    Hypertension    Osteoporosis     Social History   Socioeconomic History   Marital status: Married    Spouse name: Not on file   Number of children: Not on file   Years of education: Not on file   Highest education level: Not on file  Occupational History   Not on file  Tobacco Use   Smoking status: Former    Packs/day: 0.50    Years: 3.00    Total pack years: 1.50    Types: Cigarettes    Quit date: 11/08/1987    Years since quitting: 34.6   Smokeless tobacco: Never  Vaping Use   Vaping Use: Never used  Substance and Sexual Activity   Alcohol use: No   Drug use: No   Sexual activity: Not on file  Other Topics Concern   Not on file  Social History Narrative   Not on file   Social Determinants of Health   Financial Resource Strain: Low Risk  (02/27/2021)   Overall Financial Resource Strain (CARDIA)    Difficulty of Paying Living Expenses: Not hard at all  Food Insecurity: No Food Insecurity (02/27/2021)   Hunger Vital Sign    Worried About Running Out of Food in the Last Year: Never true     Ran Out of Food in the Last Year: Never true  Transportation Needs: No Transportation Needs (02/27/2021)   PRAPARE - Administrator, Civil Service (Medical): No    Lack of Transportation (Non-Medical): No  Physical Activity: Inactive (02/27/2021)   Exercise Vital Sign    Days of Exercise per Week: 0 days    Minutes of Exercise per Session: 0 min  Stress: No Stress Concern Present (02/27/2021)   [employer] of Occupational Health - Occupational Stress Questionnaire    Feeling of Stress : Not at all  Social Connections: Moderately Isolated (02/27/2021)   Social Connection and Isolation Panel [NHANES]    Frequency of Communication with Friends and Family: More than three times a week    Frequency of Social Gatherings with Friends and Family: More than three times a week    Attends Religious Services: More than 4 times per year    Active Member of [employer] or Organizations: No    Attends Banker Meetings: Never    Marital Status: Widowed  Intimate Partner Violence: Not At Risk (02/27/2021)   Humiliation, Afraid, Rape, and Kick questionnaire    Fear of Current  or Ex-Partner: No    Emotionally Abused: No    Physically Abused: No    Sexually Abused: No    Past Surgical History:  Procedure Laterality Date   BUNIONECTOMY     CATARACTS     COLONOSCOPY  06/2002   JOINT REPLACEMENT  2009, 2010   knee replacements   NASAL SINUS SURGERY     VOCAL CORD POLYPS     REMOVED    Family History  Problem Relation Age of Onset   Colon cancer Sister 62   Cancer Father        prostate   Arthritis Father     Allergies  Allergen Reactions   Alendronate Sodium     REACTION: constipation   Aspirin    Celecoxib     REACTION: GI upset   Penicillins     REACTION: serious dehydration, hospitalized in 1990    Current Outpatient Medications on File Prior to Visit  Medication Sig Dispense Refill   acetaminophen (TYLENOL) 650 MG CR tablet Take 1,300 mg by mouth [name] 8  (eight) hours as needed (For arthritis pain.).     lisinopril-hydrochlorothiazide (ZESTORETIC) 20-12.5 MG tablet TAKE ONE TABLET ONCE DAILY 90 tablet 0   meclizine (ANTIVERT) 25 MG tablet Take 25 mg by mouth 3 (three) times daily as needed.     montelukast (SINGULAIR) 10 MG tablet Take 1 tablet (10 mg total) by mouth at bedtime. 30 tablet 3   No current facility-administered medications on file prior to visit.    BP 120/80   Pulse 85   Temp 97.8 F (36.6 C) (Oral)   Ht 4\' 8"  (1.422 m)   Wt 158 lb (71.7 kg)   SpO2 97%   BMI 35.42 kg/m       Objective:   Physical Exam Vitals and nursing note reviewed.  Constitutional:      Appearance: Normal appearance.  Cardiovascular:     Rate and Rhythm: Normal rate and regular rhythm.     Pulses: Normal pulses.     Heart sounds: Normal heart sounds.  Pulmonary:     Effort: Pulmonary effort is normal.     Breath sounds: Normal breath sounds.  Abdominal:     General: Abdomen is flat. Bowel sounds are increased.     Palpations: Abdomen is soft.     Tenderness: There is no abdominal tenderness.  Genitourinary:    Rectum: Normal. Guaiac result negative.  Musculoskeletal:        General: Normal range of motion.  Skin:    General: Skin is warm and dry.     Capillary Refill: Capillary refill takes less than 2 seconds.  Neurological:     General: No focal deficit present.     Mental Status: She is alert and oriented to person, place, and time.  Psychiatric:        Mood and Affect: Mood normal.        Behavior: Behavior normal.        Thought Content: Thought content normal.        Judgment: Judgment normal.       Assessment & Plan:  1. Gastroenteritis - hemoccult negative  - Likely has a touch of gastroenteritis from something she ate yesterday. Redness in stool may be from partially digested tomato? Follow up if she continues to see redness in stool  - Can take imodium if diarrhea does not resolve   Dorothyann Peng, NP

## 2022-08-05 DIAGNOSIS — L905 Scar conditions and fibrosis of skin: Secondary | ICD-10-CM | POA: Diagnosis not present

## 2022-08-05 DIAGNOSIS — L570 Actinic keratosis: Secondary | ICD-10-CM | POA: Diagnosis not present

## 2022-08-05 DIAGNOSIS — Z85828 Personal history of other malignant neoplasm of skin: Secondary | ICD-10-CM | POA: Diagnosis not present

## 2022-08-05 DIAGNOSIS — L814 Other melanin hyperpigmentation: Secondary | ICD-10-CM | POA: Diagnosis not present

## 2022-08-05 DIAGNOSIS — Z08 Encounter for follow-up examination after completed treatment for malignant neoplasm: Secondary | ICD-10-CM | POA: Diagnosis not present

## 2022-08-05 DIAGNOSIS — D225 Melanocytic nevi of trunk: Secondary | ICD-10-CM | POA: Diagnosis not present

## 2022-08-05 DIAGNOSIS — L821 Other seborrheic keratosis: Secondary | ICD-10-CM | POA: Diagnosis not present

## 2022-08-20 ENCOUNTER — Encounter: Payer: Medicare PPO | Admitting: Family Medicine

## 2022-08-26 DIAGNOSIS — M25531 Pain in right wrist: Secondary | ICD-10-CM | POA: Diagnosis not present

## 2022-08-26 DIAGNOSIS — M79642 Pain in left hand: Secondary | ICD-10-CM | POA: Diagnosis not present

## 2022-08-26 DIAGNOSIS — M25532 Pain in left wrist: Secondary | ICD-10-CM | POA: Diagnosis not present

## 2022-08-28 ENCOUNTER — Ambulatory Visit (INDEPENDENT_AMBULATORY_CARE_PROVIDER_SITE_OTHER): Payer: Medicare PPO | Admitting: Family Medicine

## 2022-08-28 ENCOUNTER — Encounter: Payer: Self-pay | Admitting: Family Medicine

## 2022-08-28 VITALS — BP 130/80 | HR 82 | Temp 97.7°F | Ht <= 58 in | Wt 162.3 lb

## 2022-08-28 DIAGNOSIS — Z0000 Encounter for general adult medical examination without abnormal findings: Secondary | ICD-10-CM | POA: Diagnosis not present

## 2022-08-28 DIAGNOSIS — E785 Hyperlipidemia, unspecified: Secondary | ICD-10-CM | POA: Diagnosis not present

## 2022-08-28 DIAGNOSIS — I10 Essential (primary) hypertension: Secondary | ICD-10-CM

## 2022-08-28 LAB — CBC WITH DIFFERENTIAL/PLATELET
Basophils Absolute: 0 10*3/uL (ref 0.0–0.1)
Basophils Relative: 0.4 % (ref 0.0–3.0)
Eosinophils Absolute: 0 10*3/uL (ref 0.0–0.7)
Eosinophils Relative: 0 % (ref 0.0–5.0)
HCT: 43.6 % (ref 36.0–46.0)
Hemoglobin: 14.8 g/dL (ref 12.0–15.0)
Lymphocytes Relative: 19 % (ref 12.0–46.0)
Lymphs Abs: 2.2 10*3/uL (ref 0.7–4.0)
MCHC: 33.8 g/dL (ref 30.0–36.0)
MCV: 97.8 fl (ref 78.0–100.0)
Monocytes Absolute: 0.6 10*3/uL (ref 0.1–1.0)
Monocytes Relative: 5.2 % (ref 3.0–12.0)
Neutro Abs: 8.7 10*3/uL — ABNORMAL HIGH (ref 1.4–7.7)
Neutrophils Relative %: 75.4 % (ref 43.0–77.0)
Platelets: 249 10*3/uL (ref 150.0–400.0)
RBC: 4.46 Mil/uL (ref 3.87–5.11)
RDW: 13.9 % (ref 11.5–15.5)
WBC: 11.5 10*3/uL — ABNORMAL HIGH (ref 4.0–10.5)

## 2022-08-28 LAB — BASIC METABOLIC PANEL
BUN: 21 mg/dL (ref 6–23)
CO2: 28 mEq/L (ref 19–32)
Calcium: 10.2 mg/dL (ref 8.4–10.5)
Chloride: 101 mEq/L (ref 96–112)
Creatinine, Ser: 0.66 mg/dL (ref 0.40–1.20)
GFR: 81.9 mL/min (ref >60.00)
Glucose, Bld: 99 mg/dL (ref 70–99)
Potassium: 3.7 mEq/L (ref 3.5–5.1)
Sodium: 137 mEq/L (ref 135–145)

## 2022-08-28 LAB — HEPATIC FUNCTION PANEL
ALT: 68 U/L — ABNORMAL HIGH (ref 0–35)
AST: 55 U/L — ABNORMAL HIGH (ref 0–37)
Albumin: 4.6 g/dL (ref 3.5–5.2)
Alkaline Phosphatase: 63 U/L (ref 39–117)
Bilirubin, Direct: 0.1 mg/dL (ref 0.0–0.3)
Total Bilirubin: 0.6 mg/dL (ref 0.2–1.2)
Total Protein: 7.1 g/dL (ref 6.0–8.3)

## 2022-08-28 LAB — LIPID PANEL
Cholesterol: 240 mg/dL — ABNORMAL HIGH (ref 0–200)
HDL: 64.4 mg/dL (ref >39.00)
LDL Cholesterol: 156 mg/dL — ABNORMAL HIGH (ref 0–99)
NonHDL: 175.54
Total CHOL/HDL Ratio: 4
Triglycerides: 97 mg/dL (ref 0.0–149.0)
VLDL: 19.4 mg/dL (ref 0.0–40.0)

## 2022-08-28 LAB — TSH: TSH: 2.29 u[IU]/mL (ref 0.35–5.50)

## 2022-08-28 MED ORDER — LISINOPRIL-HYDROCHLOROTHIAZIDE 20-12.5 MG PO TABS: 1.0000 | ORAL_TABLET | Freq: Every day | ORAL | 3 refills | Status: DC

## 2022-08-28 NOTE — Progress Notes (Signed)
Established Patient Office Visit  Subjective   Patient ID: [name], female    DOB: [birth year]  Age: 81 y.o. MRN: [id number]  Chief Complaint  Patient presents with   Annual Exam    HPI   [name] is seen for physical exam.  She has history of hypertension treated with lisinopril HCTZ for several years.  Blood pressures remained stable.  She also has history of mild hyperlipidemia.  She has seasonal allergies and takes Singulair as needed.  She had some stress urinary incontinence.  She has been widowed now for 10 years.  She has a son who lives nearby who has been very helpful to her.  She has a couple grandchildren.  One is in medical school at [employer].  Her major issues do with arthritis.  She has had some recent steroid injections in her hands due to the severe thumb arthritis.  She sees orthopedics for that.  Health maintenance  -She declines flu vaccine -She has had Zostavax and thinks she has had Shingrix but we have no documentation -Aged out of colonoscopy -Declines further mammograms -Tetanus due 2028 -Pneumonia vaccines complete  Social history-widowed for 10 years.  She has a son who lives nearby who helps considerably.  2 grandchildren.  Non-smoker.  No alcohol.  Family history reviewed with no significant changes  Past Medical History:  Diagnosis Date   Arthritis    Cataract    Hypertension    Osteoporosis    Past Surgical History:  Procedure Laterality Date   BUNIONECTOMY     CATARACTS     COLONOSCOPY  06/2002   JOINT REPLACEMENT  2009, 2010   knee replacements   NASAL SINUS SURGERY     VOCAL CORD POLYPS     REMOVED    reports that she quit smoking about 34 years ago. Her smoking use included cigarettes. She has a 1.50 pack-year smoking history. She has never used smokeless tobacco. She reports that she does not drink alcohol and does not use drugs. family history includes Arthritis in her father; Cancer in her father; Colon cancer  (age of onset: 79) in her sister. Allergies  Allergen Reactions   Alendronate Sodium     REACTION: constipation   Aspirin    Celecoxib     REACTION: GI upset   Penicillins     REACTION: serious dehydration, hospitalized in 1990   ' Review of Systems  Constitutional:  Negative for chills, fever, malaise/fatigue and weight loss.  HENT:  Negative for hearing loss.   Eyes:  Negative for blurred vision and double vision.  Respiratory:  Negative for cough and shortness of breath.   Cardiovascular:  Negative for chest pain, palpitations and leg swelling.  Gastrointestinal:  Negative for abdominal pain, blood in stool, constipation and diarrhea.  Genitourinary:  Negative for dysuria.  Musculoskeletal:  Positive for joint pain.  Skin:  Negative for rash.  Neurological:  Negative for dizziness, speech change, seizures, loss of consciousness and headaches.  Psychiatric/Behavioral:  Negative for depression.       Objective:     BP 130/80 (BP Location: Left Arm, Patient Position: Sitting, Cuff Size: Normal)   Pulse 82   Temp 97.7 F (36.5 C) (Oral)   Ht 4' 8.3" (1.43 m)   Wt 162 lb 4.8 oz (73.6 kg)   SpO2 95%   BMI 36.00 kg/m  BP Readings from Last 3 Encounters:  08/28/22 130/80  06/21/22 120/80  07/11/21 124/70   Wt Readings  from Last 3 Encounters:  08/28/22 162 lb 4.8 oz (73.6 kg)  06/21/22 158 lb (71.7 kg)  07/11/21 161 lb 11.2 oz (73.3 kg)      Physical Exam Vitals reviewed.  Constitutional:      Appearance: She is well-developed.  HENT:     Head: Normocephalic and atraumatic.  Eyes:     Pupils: Pupils are equal, round, and reactive to light.  Neck:     Thyroid: No thyromegaly.  Cardiovascular:     Rate and Rhythm: Normal rate and regular rhythm.     Heart sounds: Normal heart sounds. No murmur heard. Pulmonary:     Effort: No respiratory distress.     Breath sounds: Normal breath sounds. No wheezing or rales.  Abdominal:     General: Bowel sounds are  normal. There is no distension.     Palpations: Abdomen is soft. There is no mass.     Tenderness: There is no abdominal tenderness. There is no guarding or rebound.  Musculoskeletal:        General: Normal range of motion.     Cervical back: Normal range of motion and neck supple.  Lymphadenopathy:     Cervical: No cervical adenopathy.  Skin:    Findings: No rash.  Neurological:     Mental Status: She is alert and oriented to person, place, and time.     Cranial Nerves: No cranial nerve deficit.  Psychiatric:        Behavior: Behavior normal.        Thought Content: Thought content normal.        Judgment: Judgment normal.      No results found for any visits on 08/28/22.    The ASCVD Risk score (Arnett DK, et al., 2019) failed to calculate for the following reasons:   The 2019 ASCVD risk score is only valid for ages 29 to 102    Assessment & Plan:   Problem List Items Addressed This Visit       Unprioritized   Hyperlipidemia   Relevant Medications   lisinopril-hydrochlorothiazide (ZESTORETIC) 20-12.5 MG tablet   Other Relevant Orders   Lipid panel   Hepatic function panel   Hypertension - Primary   Relevant Medications   lisinopril-hydrochlorothiazide (ZESTORETIC) 20-12.5 MG tablet   Other Relevant Orders   Basic metabolic panel   Other Visit Diagnoses     Physical exam       Relevant Orders   CBC with Differential/Platelet   TSH     -We offered and recommended flu vaccine but she declines  -Refill lisinopril HCTZ for 1 year  -Check labs as above  -We discussed mammograms but she declines further mammograms.  She is aged out of colonoscopy.  Also discussed Shingrix vaccine but she thinks she is already had this.  No follow-ups on file.    Evelena Peat, MD

## 2022-08-29 ENCOUNTER — Other Ambulatory Visit: Payer: Self-pay

## 2022-08-29 DIAGNOSIS — E785 Hyperlipidemia, unspecified: Secondary | ICD-10-CM

## 2022-09-09 ENCOUNTER — Telehealth: Payer: Self-pay | Admitting: Family Medicine

## 2022-09-09 NOTE — Telephone Encounter (Signed)
Pt stated at her last visit it shown that her liver has elevated enzymes; believe Tylenol is the cause. So pt is wanting to see if their is any other over the counter meds she can take that helps with px. She is allergic to aspirin.   Pt would like a call back with a suggestion.   Please advise.

## 2022-09-10 MED ORDER — TRAMADOL HCL 50 MG PO TABS: 50.0000 mg | ORAL_TABLET | Freq: Four times a day (QID) | ORAL | 0 refills | Status: AC | PRN

## 2022-09-10 NOTE — Telephone Encounter (Signed)
Patient informed of the message and expressed understanding. Patient inquired if PCP can send a prescription for Tramadol to her pharmacy

## 2022-09-11 ENCOUNTER — Telehealth: Payer: Self-pay | Admitting: Family Medicine

## 2022-09-11 NOTE — Telephone Encounter (Signed)
Pt said she does not want to take opioid and does not want to take tramadol. Pt need something for headaches.  [facility] [place], [place] - [address] [employer] Phone: [phone]

## 2022-09-12 NOTE — Telephone Encounter (Signed)
Patient returned call

## 2022-09-12 NOTE — Telephone Encounter (Signed)
Left a message for the patient to return my call.  

## 2022-09-13 NOTE — Telephone Encounter (Signed)
Called pt on both numbers

## 2022-09-18 DIAGNOSIS — R051 Acute cough: Secondary | ICD-10-CM | POA: Diagnosis not present

## 2022-09-18 DIAGNOSIS — Z20822 Contact with and (suspected) exposure to covid-19: Secondary | ICD-10-CM | POA: Diagnosis not present

## 2022-09-18 DIAGNOSIS — U071 COVID-19: Secondary | ICD-10-CM | POA: Diagnosis not present

## 2022-09-18 NOTE — Telephone Encounter (Signed)
Patient informed of the message and verbalized understanding 

## 2022-10-01 ENCOUNTER — Ambulatory Visit: Payer: Medicare PPO | Admitting: Family Medicine

## 2022-10-01 ENCOUNTER — Other Ambulatory Visit: Payer: Medicare PPO

## 2022-10-01 ENCOUNTER — Encounter: Payer: Self-pay | Admitting: Family Medicine

## 2022-10-01 VITALS — BP 120/74 | HR 85 | Temp 97.5°F | Ht <= 58 in | Wt 161.7 lb

## 2022-10-01 DIAGNOSIS — E785 Hyperlipidemia, unspecified: Secondary | ICD-10-CM

## 2022-10-01 DIAGNOSIS — R7401 Elevation of levels of liver transaminase levels: Secondary | ICD-10-CM | POA: Diagnosis not present

## 2022-10-01 DIAGNOSIS — J0110 Acute frontal sinusitis, unspecified: Secondary | ICD-10-CM

## 2022-10-01 MED ORDER — DOXYCYCLINE HYCLATE 100 MG PO CAPS: 100.0000 mg | ORAL_CAPSULE | Freq: Two times a day (BID) | ORAL | 0 refills | Status: DC

## 2022-10-01 NOTE — Progress Notes (Signed)
Established Patient Office Visit  Subjective   Patient ID: [name], female    DOB: 06-May-1941  Age: 82 y.o. MRN: [id number]  Chief Complaint  Patient presents with   Follow-up   Headache    Patient complains of headaches     HPI   [name] is seen with complaints of bifrontal headaches for the past week or so.  Recent history is that she was diagnosed with COVID Wednesday after Christmas.  She developed on Christmas Eve low-grade fever and some nasal congestion.  She went to urgent care and was diagnosed with COVID on Wednesday but not treated with any antivirals.  She was treated with Zyrtec and Tessalon.  Never had much cough.  Fever only lasted about a day.  Since that time his had some daily bifrontal headaches and occasional bloody nasal discharge and occasional colored nasal mucus.  She feels like her headache is related to sinus infection.  Does not typically get headaches.  She had recent lab work and had mildly elevated liver transaminases.  She states that she was using perhaps acetaminophen excessively at that time.  She is here for follow-up liver panel today  Past Medical History:  Diagnosis Date   Arthritis    Cataract    Hypertension    Osteoporosis    Past Surgical History:  Procedure Laterality Date   BUNIONECTOMY     CATARACTS     COLONOSCOPY  06/2002   JOINT REPLACEMENT  2009, 2010   knee replacements   NASAL SINUS SURGERY     VOCAL CORD POLYPS     REMOVED    reports that she quit smoking about 34 years ago. Her smoking use included cigarettes. She has a 1.50 pack-year smoking history. She has never used smokeless tobacco. She reports that she does not drink alcohol and does not use drugs. family history includes Arthritis in her father; Cancer in her father; Colon cancer (age of onset: 56) in her sister. Allergies  Allergen Reactions   Alendronate Sodium     REACTION: constipation   Aspirin    Celecoxib     REACTION: GI upset   Penicillins      REACTION: serious dehydration, hospitalized in 1990    Review of Systems  Constitutional:  Positive for malaise/fatigue. Negative for chills and fever.  HENT:  Positive for congestion and sinus pain. Negative for nosebleeds.   Respiratory:  Negative for cough.   Cardiovascular:  Negative for chest pain.  Neurological:  Positive for headaches.      Objective:     BP 120/74 (BP Location: Left Arm, Patient Position: Sitting, Cuff Size: Normal)   Pulse 85   Temp (!) 97.5 F (36.4 C) (Oral)   Ht 4' 8.3" (1.43 m)   Wt 161 lb 11.2 oz (73.3 kg)   SpO2 97%   BMI 35.87 kg/m    Physical Exam Vitals reviewed.  Constitutional:      General: She is not in acute distress. HENT:     Mouth/Throat:     Mouth: Mucous membranes are moist.     Pharynx: Oropharynx is clear.  Cardiovascular:     Rate and Rhythm: Normal rate and regular rhythm.  Pulmonary:     Effort: Pulmonary effort is normal.     Breath sounds: Normal breath sounds.  Musculoskeletal:     Cervical back: Neck supple.  Neurological:     Mental Status: She is alert.      No results found  for any visits on 10/01/22.    The ASCVD Risk score (Arnett DK, et al., 2019) failed to calculate for the following reasons:   The 2019 ASCVD risk score is only valid for ages 52 to 52    Assessment & Plan:   #1 headaches.  These are bifrontal and suspect related to acute sinusitis following recent COVID infection.  Given duration of symptoms start doxycycline 100 mg twice daily for 10 days.  She is penicillin allergic.  Stay well-hydrated.  Touch base if not improving over the next week  #2 recent mild liver transaminase elevations.  Etiology unclear.  Recent frequent acetaminophen use.  Recheck hepatic panel today.  If still elevated or climbing consider further evaluation  Evelena Peat, MD

## 2022-10-02 ENCOUNTER — Telehealth: Payer: Self-pay | Admitting: Family Medicine

## 2022-10-02 LAB — HEPATIC FUNCTION PANEL
ALT: 18 IU/L (ref 0–32)
AST: 19 IU/L (ref 0–40)
Albumin: 4.3 g/dL (ref 3.7–4.7)
Alkaline Phosphatase: 78 IU/L (ref 44–121)
Bilirubin Total: 0.4 mg/dL (ref 0.0–1.2)
Bilirubin, Direct: <0.1 mg/dL (ref 0.00–0.40)
Total Protein: 6.3 g/dL (ref 6.0–8.5)

## 2022-10-02 NOTE — Telephone Encounter (Signed)
Says she got dizzy/vertigo after taking the  recently prescribed doxycycline (VIBRAMYCIN) 100 MG capsule,does not want to take any more and requesting meclizine (ANTIVERT) 25 MG tablet  [place], [place] Phone: [phone]  Fax: [phone]

## 2022-10-03 MED ORDER — MECLIZINE HCL 25 MG PO TABS: ORAL_TABLET | ORAL | 0 refills | Status: DC

## 2022-10-03 NOTE — Addendum Note (Signed)
Addended by: Nilda Riggs on: 10/03/2022 08:40 AM   Modules accepted: Orders

## 2022-10-03 NOTE — Telephone Encounter (Signed)
Patient informed of the message and expressed understanding. RX sent. Patient inquired if she still needs to take Doxycycline?

## 2022-10-03 NOTE — Addendum Note (Signed)
Addended by: Nilda Riggs on: 10/03/2022 08:50 AM   Modules accepted: Orders

## 2022-10-04 NOTE — Telephone Encounter (Signed)
Patient informed of the message below and verbalized understanding.  

## 2022-11-21 DIAGNOSIS — M25562 Pain in left knee: Secondary | ICD-10-CM | POA: Diagnosis not present

## 2022-11-24 DIAGNOSIS — R21 Rash and other nonspecific skin eruption: Secondary | ICD-10-CM | POA: Diagnosis not present

## 2022-11-24 DIAGNOSIS — B029 Zoster without complications: Secondary | ICD-10-CM | POA: Diagnosis not present

## 2023-01-04 DIAGNOSIS — J189 Pneumonia, unspecified organism: Secondary | ICD-10-CM | POA: Diagnosis not present

## 2023-01-04 DIAGNOSIS — Z20822 Contact with and (suspected) exposure to covid-19: Secondary | ICD-10-CM | POA: Diagnosis not present

## 2023-01-09 ENCOUNTER — Encounter: Payer: Self-pay | Admitting: Family Medicine

## 2023-01-09 ENCOUNTER — Ambulatory Visit: Payer: Medicare PPO | Admitting: Family Medicine

## 2023-01-09 VITALS — BP 122/80 | HR 100 | Temp 97.9°F | Ht <= 58 in | Wt 157.2 lb

## 2023-01-09 DIAGNOSIS — J309 Allergic rhinitis, unspecified: Secondary | ICD-10-CM | POA: Diagnosis not present

## 2023-01-09 DIAGNOSIS — Z0189 Encounter for other specified special examinations: Secondary | ICD-10-CM

## 2023-01-09 DIAGNOSIS — J189 Pneumonia, unspecified organism: Secondary | ICD-10-CM | POA: Diagnosis not present

## 2023-01-09 LAB — COMPREHENSIVE METABOLIC PANEL
ALT: 17 U/L (ref 0–35)
AST: 18 U/L (ref 0–37)
Albumin: 4.1 g/dL (ref 3.5–5.2)
Alkaline Phosphatase: 55 U/L (ref 39–117)
BUN: 11 mg/dL (ref 6–23)
CO2: 30 mEq/L (ref 19–32)
Calcium: 9.7 mg/dL (ref 8.4–10.5)
Chloride: 99 mEq/L (ref 96–112)
Creatinine, Ser: 0.62 mg/dL (ref 0.40–1.20)
GFR: 82.93 mL/min (ref >60.00)
Glucose, Bld: 85 mg/dL (ref 70–99)
Potassium: 3 mEq/L — ABNORMAL LOW (ref 3.5–5.1)
Sodium: 137 mEq/L (ref 135–145)
Total Bilirubin: 0.4 mg/dL (ref 0.2–1.2)
Total Protein: 6.7 g/dL (ref 6.0–8.3)

## 2023-01-09 NOTE — Patient Instructions (Signed)
You can continue using Claritin and or Allegra for allergy symptoms.  As sure long symptoms are improving antibiotic time is okay to continue using the Tessalon for cough.  If you have the 100 mg tabs you can take 2 for total dose of 200 mg if needed.

## 2023-01-09 NOTE — Progress Notes (Signed)
Established Patient Office Visit   Subjective  Patient ID: [name], female    DOB: 1941-08-23  Age: 82 y.o. MRN: [id number]  Chief Complaint  Patient presents with   Hospitalization Follow-up    Pt is an 82 yo female with pmh sig for seasonal allergies and HTN who is followed by Dr. Caryl Never and seen for f/u on acute concern.  Pt states she was seen at [facility] 01/04/23 for cough, fever.  States CXR with "the beginnings of pneumonia".  Given Z-Pak and Tessalon.  Patient states she finished a Z-Pak yesterday but is still having rhinorrhea and intermittent cough.  Patient denies worsening symptoms or fever.  Patient typically takes Allegra and Claritin for allergies but started Zyrtec and Mucinex for recent symptoms.  Patient states Mucinex made her cough more.  Patient inquires about having liver function checked.  States in December she was taking "too much Tylenol" and LFTs were elevated.  Wants to make sure they have improved.    Patient Active Problem List   Diagnosis Date Noted   Obesity (BMI 30-39.9) 08/10/2013   SUI (stress urinary incontinence, female) 05/21/2012   UNSPECIFIED URINARY INCONTINENCE 12/19/2009   ALLERGIC RHINITIS CAUSE UNSPECIFIED 04/05/2009   Hyperlipidemia 02/13/2009   ECZEMA 02/13/2009   OSTEOPOROSIS 02/13/2009   Hypertension 01/04/2009   Past Surgical History:  Procedure Laterality Date   BUNIONECTOMY     CATARACTS     COLONOSCOPY  06/2002   JOINT REPLACEMENT  2009, 2010   knee replacements   NASAL SINUS SURGERY     VOCAL CORD POLYPS     REMOVED   Social History   Tobacco Use   Smoking status: Former    Packs/day: 0.50    Years: 3.00    Additional pack years: 0.00    Total pack years: 1.50    Types: Cigarettes    Quit date: 11/08/1987    Years since quitting: 35.1   Smokeless tobacco: Never  Vaping Use   Vaping Use: Never used  Substance Use Topics   Alcohol use: No   Drug use: No   Family History  Problem Relation Age of Onset    Colon cancer Sister 32   Cancer Father        prostate   Arthritis Father    Allergies  Allergen Reactions   Alendronate Sodium     REACTION: constipation   Aspirin    Celecoxib     REACTION: GI upset   Penicillins     REACTION: serious dehydration, hospitalized in 1990      ROS Negative unless stated above    Objective:     BP 122/80 (BP Location: Left Arm, Patient Position: Sitting, Cuff Size: Normal)   Pulse 100   Temp 97.9 F (36.6 C) (Oral)   Ht  (1.422 m)   Wt 157 lb 3.2 oz (71.3 kg)   SpO2 96%   BMI 35.24 kg/m    Physical Exam Constitutional:      General: She is not in acute distress.    Appearance: Normal appearance.  HENT:     Head: Normocephalic and atraumatic.     Right Ear: Tympanic membrane normal.     Left Ear: Tympanic membrane normal.     Nose: Nose normal.     Mouth/Throat:     Mouth: Mucous membranes are moist.  Eyes:     Extraocular Movements: Extraocular movements intact.     Conjunctiva/sclera: Conjunctivae normal.     Pupils:  Pupils are equal, round, and reactive to light.  Cardiovascular:     Rate and Rhythm: Normal rate and regular rhythm.     Heart sounds: Normal heart sounds. No murmur heard.    No gallop.  Pulmonary:     Effort: Pulmonary effort is normal. No respiratory distress.     Breath sounds: Normal breath sounds. No wheezing, rhonchi or rales.     Comments: Mildly decreased breath sounds bilaterally.  No wheezing.  Minimal coughing. Skin:    General: Skin is warm and dry.  Neurological:     Mental Status: She is alert and oriented to person, place, and time.      No results found for any visits on 01/09/23.    Assessment & Plan:  Pneumonia due to infectious organism, unspecified laterality, unspecified part of lung  Patient requested diagnostic testing -     Comprehensive metabolic panel  Allergic rhinitis, unspecified seasonality, unspecified trigger  Dx'd with pneumonia 01/04/23 at [facility].  Completed  Z-Pak.  Symptoms improving.  Discussed likely duration of sxs.  Continue supportive care including Tessalon.  Advised to restart alternating Claritin and Allegra weekly as Zyrtec not as helpful.  Consider Flonase or saline nasal rinse.  Given strict precautions.  Return if symptoms worsen or fail to improve.   Deeann Saint, MD

## 2023-01-10 ENCOUNTER — Other Ambulatory Visit: Payer: Self-pay | Admitting: Family Medicine

## 2023-01-10 DIAGNOSIS — E876 Hypokalemia: Secondary | ICD-10-CM

## 2023-01-10 MED ORDER — POTASSIUM CHLORIDE CRYS ER 20 MEQ PO TBCR: 40.0000 meq | EXTENDED_RELEASE_TABLET | Freq: Every day | ORAL | 0 refills | Status: DC

## 2023-02-04 DIAGNOSIS — R238 Other skin changes: Secondary | ICD-10-CM | POA: Diagnosis not present

## 2023-02-04 DIAGNOSIS — L578 Other skin changes due to chronic exposure to nonionizing radiation: Secondary | ICD-10-CM | POA: Diagnosis not present

## 2023-02-04 DIAGNOSIS — L821 Other seborrheic keratosis: Secondary | ICD-10-CM | POA: Diagnosis not present

## 2023-02-04 DIAGNOSIS — L720 Epidermal cyst: Secondary | ICD-10-CM | POA: Diagnosis not present

## 2023-02-05 ENCOUNTER — Ambulatory Visit (INDEPENDENT_AMBULATORY_CARE_PROVIDER_SITE_OTHER): Payer: Medicare PPO | Admitting: Family Medicine

## 2023-02-05 ENCOUNTER — Encounter: Payer: Self-pay | Admitting: Family Medicine

## 2023-02-05 VITALS — BP 112/74 | HR 90 | Temp 98.6°F | Wt 159.6 lb

## 2023-02-05 DIAGNOSIS — R490 Dysphonia: Secondary | ICD-10-CM | POA: Diagnosis not present

## 2023-02-05 DIAGNOSIS — I10 Essential (primary) hypertension: Secondary | ICD-10-CM

## 2023-02-05 DIAGNOSIS — Z9889 Other specified postprocedural states: Secondary | ICD-10-CM

## 2023-02-05 NOTE — Patient Instructions (Signed)
For continued dry cough/throat clearing consider switching blood pressure medication as lisinopril can cause a dry cough at any time.  If your blood pressure medication is causing cough it will resolve upon stopping medicine.  A referral to ENT was placed.  They should call you about setting up this appointment.

## 2023-02-05 NOTE — Progress Notes (Signed)
Established Patient Office Visit   Subjective  Patient ID: [name], female    DOB: Aug 10, 1941  Age: 82 y.o. MRN: [id number]  Chief Complaint  Patient presents with   vocal cords    Hoarseness at times, sometimes it takes a while to talk in the ,mornings., years ago had polyps on her cords.     Patient is an 82 year old female followed by Dr. Caryl Never and seen for ongoing concern.  Patient endorses waking up course each morning.  At times unable to speak.  Hoarse quality to voice continues throughout the day.  Also notes dry cough/throat clearing.  Patient with a history of vocal cord polyps status post polypectomy 20+ years ago.  States symptoms feel similar.  Patient denies sore throat, rhinorrhea, acid reflux symptoms, sour acid taste in mouth, laying down right after eating.  Sleeping elevated on 2 pillows.  Tried Mucinex for 2 days for symptoms without improvement.  Unsure if she really has seasonal allergies, but alternating Claritin, Zyrtec, Allegra weekly.    Past Medical History:  Diagnosis Date   Arthritis    Cataract    Hypertension    Osteoporosis    Past Surgical History:  Procedure Laterality Date   BUNIONECTOMY     CATARACTS     COLONOSCOPY  06/2002   JOINT REPLACEMENT  2009, 2010   knee replacements   NASAL SINUS SURGERY     VOCAL CORD POLYPS     REMOVED   Social History   Tobacco Use   Smoking status: Former    Packs/day: 0.50    Years: 3.00    Additional pack years: 0.00    Total pack years: 1.50    Types: Cigarettes    Quit date: 11/08/1987    Years since quitting: 35.2   Smokeless tobacco: Never  Vaping Use   Vaping Use: Never used  Substance Use Topics   Alcohol use: No   Drug use: No   Family History  Problem Relation Age of Onset   Colon cancer Sister 46   Cancer Father        prostate   Arthritis Father    Allergies  Allergen Reactions   Alendronate Sodium     REACTION: constipation   Aspirin    Celecoxib     REACTION:  GI upset   Penicillins     REACTION: serious dehydration, hospitalized in 1990      ROS Negative unless stated above    Objective:     BP 112/74 (BP Location: Right Arm, Patient Position: Sitting, Cuff Size: Normal)   Pulse 90   Temp 98.6 F (37 C) (Oral)   Wt 159 lb 9.6 oz (72.4 kg)   SpO2 92%   BMI 35.78 kg/m    Physical Exam Constitutional:      General: She is not in acute distress.    Appearance: Normal appearance.  HENT:     Head: Normocephalic and atraumatic.     Comments: Bilateral TMs normal.    Nose: Nose normal.     Mouth/Throat:     Mouth: Mucous membranes are moist.     Pharynx: Oropharynx is clear. No pharyngeal swelling, oropharyngeal exudate or posterior oropharyngeal erythema.      Comments: Small polyp on the left pharynx. Cardiovascular:     Rate and Rhythm: Normal rate and regular rhythm.     Heart sounds: Normal heart sounds. No murmur heard.    No gallop.  Pulmonary:  Effort: Pulmonary effort is normal. No respiratory distress.     Breath sounds: Normal breath sounds. No wheezing, rhonchi or rales.  Skin:    General: Skin is warm and dry.  Neurological:     Mental Status: She is alert and oriented to person, place, and time.      No results found for any visits on 02/05/23.    Assessment & Plan:  Hoarseness -     Ambulatory referral to ENT  History of vocal cord polypectomy -     Ambulatory referral to ENT  Essential hypertension -Controlled -Continue lisinopril-hydrochlorothiazide 20-12.5 mg daily  Discussed various causes of hoarseness including GERD, polyps, postnasal drainage, strep throat or other infectious etiology.  As symptoms feel similar to previous episodes of vocal cord polyp will place referral to ENT.  Continue supportive care including drinking warm fluids, vocal rest, hydration, gargling with warm salt water or Chloraseptic spray.  For continued dry cough and throat clearing consider switching blood pressure  medication as currently taking lisinopril 20 mg.  Return if symptoms worsen or fail to improve.   Deeann Saint, MD

## 2023-02-06 ENCOUNTER — Telehealth: Payer: Self-pay | Admitting: Family Medicine

## 2023-02-06 NOTE — Telephone Encounter (Signed)
Pt called to say she followed up with the ENT she was referred to, and was told they can not fit her in until July.  Pt states she does not want to wait that long.  Pt is asking if we can give her another referral to a location that can see her sooner?  Please advise.

## 2023-02-07 NOTE — Telephone Encounter (Signed)
I will look into this.

## 2023-02-07 NOTE — Telephone Encounter (Signed)
Dr Avel Sensor office [phone]) called regarding referral [phone] stating the doctor does not see patients for throat issues and this referral needs to be redirected

## 2023-03-05 IMAGING — DX DG CHEST 2V
2 series · 2 of 2 positions shown · non-contrast
Comparison: September 25, 2012.

CLINICAL DATA: Dyspnea on exertion.

EXAM:
CHEST - 2 VIEW

[chest pa]
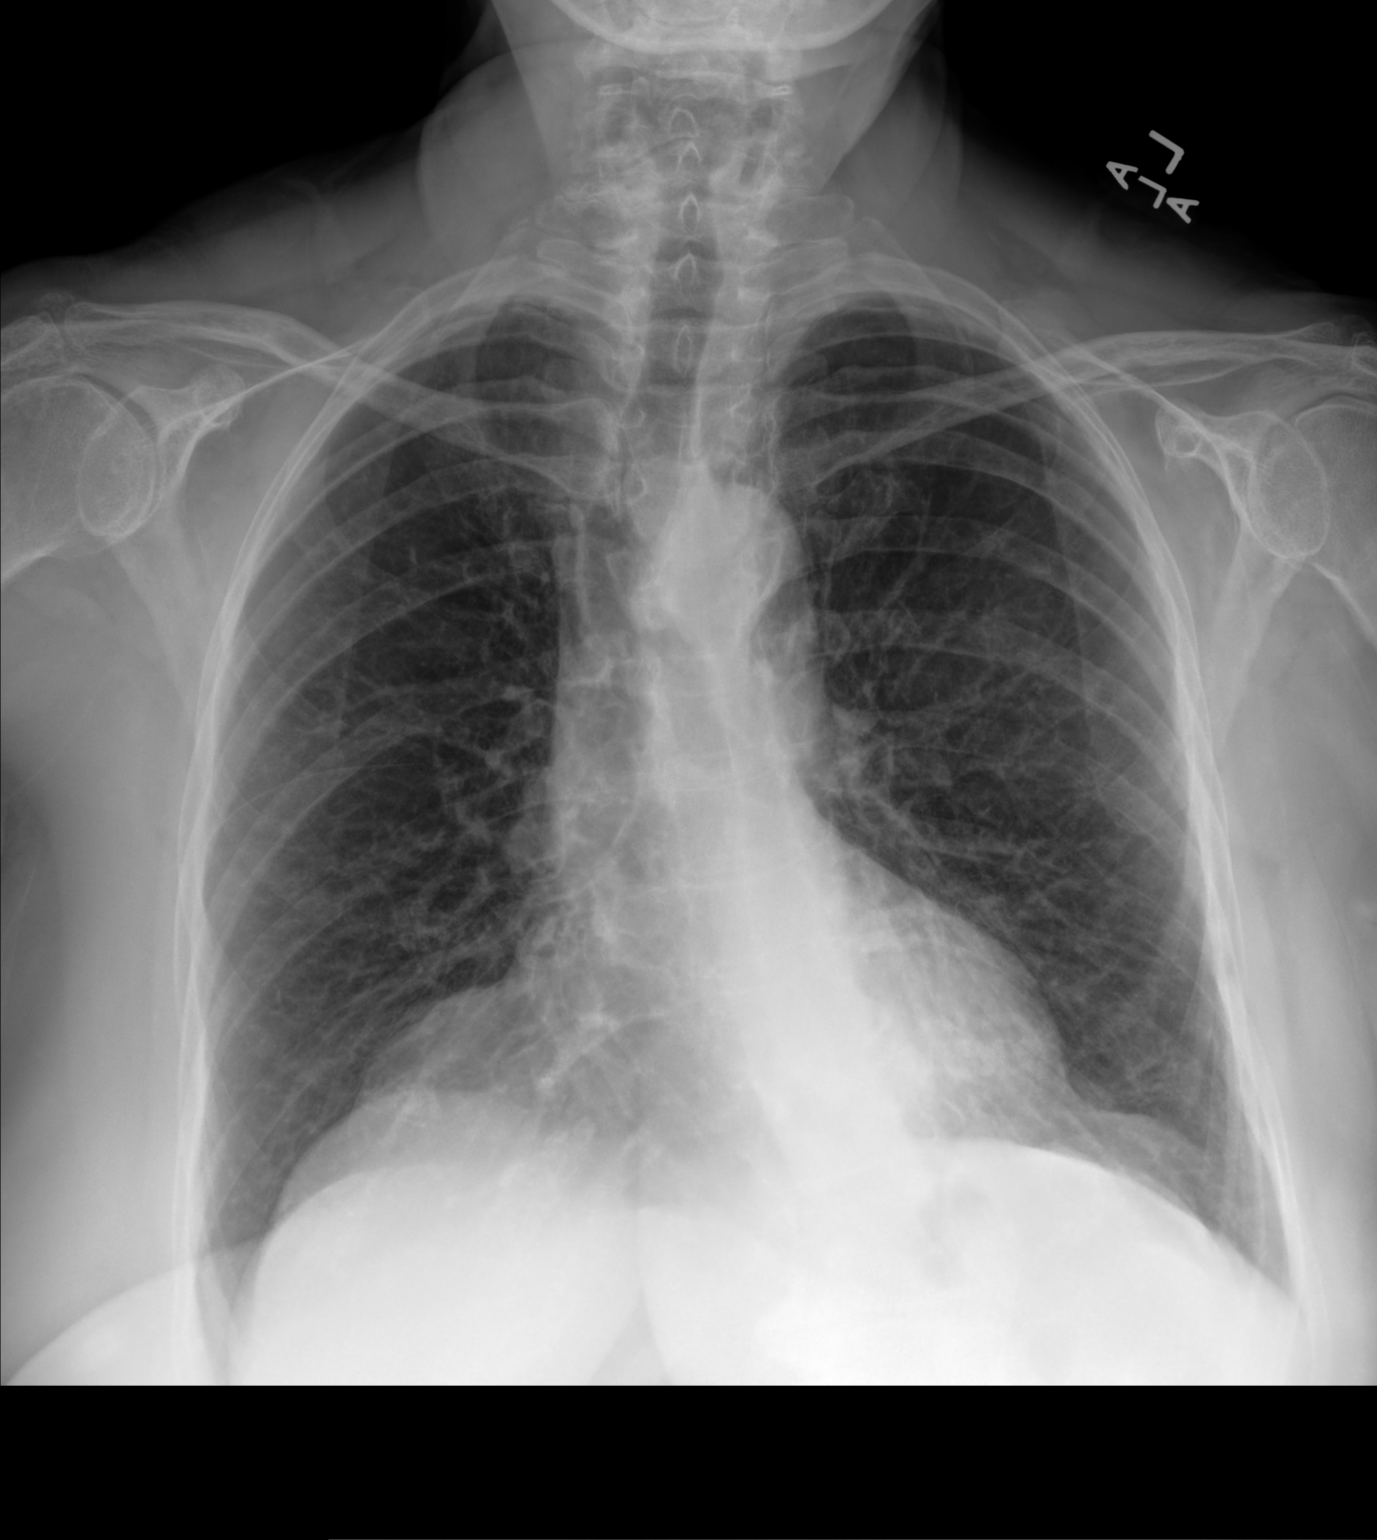

[chest lat]
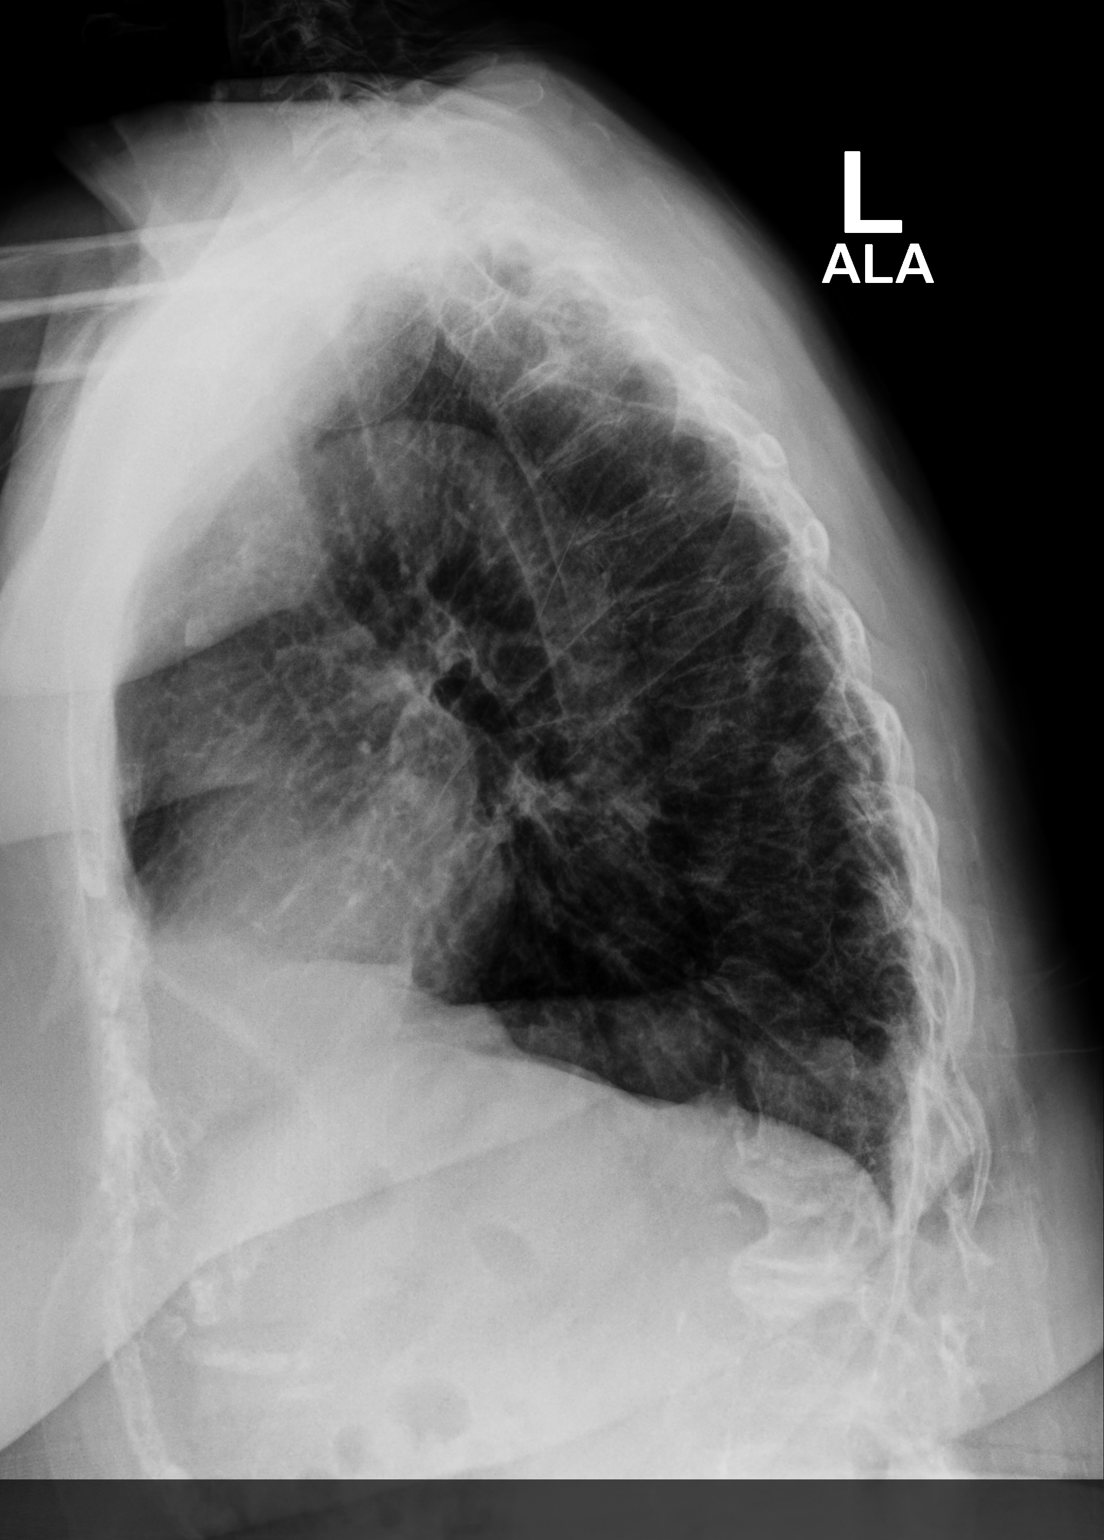

[2 of 2 positions shown; findings below may reference images not displayed]

FINDINGS: The heart size and mediastinal contours are within normal limits.
Both lungs are clear. The visualized skeletal structures are
unremarkable.
IMPRESSION: No active cardiopulmonary disease.

## 2023-03-17 DIAGNOSIS — M79644 Pain in right finger(s): Secondary | ICD-10-CM | POA: Diagnosis not present

## 2023-03-17 DIAGNOSIS — M79645 Pain in left finger(s): Secondary | ICD-10-CM | POA: Diagnosis not present

## 2023-03-25 ENCOUNTER — Encounter: Payer: Self-pay | Admitting: Family Medicine

## 2023-03-25 ENCOUNTER — Ambulatory Visit (INDEPENDENT_AMBULATORY_CARE_PROVIDER_SITE_OTHER): Payer: Medicare PPO | Admitting: Family Medicine

## 2023-03-25 VITALS — BP 120/60 | HR 85 | Temp 97.7°F | Ht <= 58 in | Wt 158.1 lb

## 2023-03-25 DIAGNOSIS — I10 Essential (primary) hypertension: Secondary | ICD-10-CM | POA: Diagnosis not present

## 2023-03-25 DIAGNOSIS — Z9889 Other specified postprocedural states: Secondary | ICD-10-CM | POA: Diagnosis not present

## 2023-03-25 DIAGNOSIS — M898X9 Other specified disorders of bone, unspecified site: Secondary | ICD-10-CM

## 2023-03-25 DIAGNOSIS — R490 Dysphonia: Secondary | ICD-10-CM | POA: Diagnosis not present

## 2023-03-25 DIAGNOSIS — L853 Xerosis cutis: Secondary | ICD-10-CM

## 2023-03-25 DIAGNOSIS — J309 Allergic rhinitis, unspecified: Secondary | ICD-10-CM | POA: Diagnosis not present

## 2023-03-25 DIAGNOSIS — E876 Hypokalemia: Secondary | ICD-10-CM | POA: Diagnosis not present

## 2023-03-25 LAB — BASIC METABOLIC PANEL
BUN: 17 mg/dL (ref 6–23)
CO2: 30 mEq/L (ref 19–32)
Calcium: 10.4 mg/dL (ref 8.4–10.5)
Chloride: 96 mEq/L (ref 96–112)
Creatinine, Ser: 0.72 mg/dL (ref 0.40–1.20)
GFR: 77.75 mL/min (ref >60.00)
Glucose, Bld: 93 mg/dL (ref 70–99)
Potassium: 3.5 mEq/L (ref 3.5–5.1)
Sodium: 135 mEq/L (ref 135–145)

## 2023-03-25 MED ORDER — TRIAMCINOLONE ACETONIDE 0.1 % EX CREA: 1.0000 | TOPICAL_CREAM | Freq: Two times a day (BID) | CUTANEOUS | 1 refills | Status: DC

## 2023-03-25 NOTE — Progress Notes (Signed)
Established Patient Office Visit  Subjective   Patient ID: [name], female    DOB: 05/14/1941  Age: 82 y.o. MRN: [id number]  Chief Complaint  Patient presents with   Mass   Medication Reaction    HPI   [name] is seen today for several issues as follows  She recently palpated area on mid parietal region near the midline and thought this was a "cyst ".  She apparently had a cyst on her scalp years ago.  She saw her hairdresser who thought this was just more of a bony ridge.  She has no pain.  No drainage.  She has hypertension treated with lisinopril HCTZ.  She had labs done back in April in my absence with potassium 3.0.  She does not recall taking any potassium replacement.  Has not had follow-up labs since then.  Denies any recent leg cramps.  She recently saw orthopedist and apparently was placed on oral prednisone for some hand arthritis.  She subsequently developed some pruritic rash both legs and thought this was related to the prednisone.  She tried aloe lotion with some improvement.  She uses nonscented soap.  Take showers.  Occasional use of moisturizers.  She had some hoarseness for several months following COVID and went to ENT earlier today and had nasolaryngoscopy which showed no polyps or lesions.  It was felt that her symptoms were probably related to postnasal drip.  She plans to take some Zyrtec for that.  Past Medical History:  Diagnosis Date   Arthritis    Cataract    Hypertension    Osteoporosis    Past Surgical History:  Procedure Laterality Date   BUNIONECTOMY     CATARACTS     COLONOSCOPY  06/2002   JOINT REPLACEMENT  2009, 2010   knee replacements   NASAL SINUS SURGERY     VOCAL CORD POLYPS     REMOVED    reports that she quit smoking about 35 years ago. Her smoking use included cigarettes. She has a 1.50 pack-year smoking history. She has never used smokeless tobacco. She reports that she does not drink alcohol and does not use  drugs. family history includes Arthritis in her father; Cancer in her father; Colon cancer (age of onset: 10) in her sister. Allergies  Allergen Reactions   Alendronate Sodium     REACTION: constipation   Aspirin    Celecoxib     REACTION: GI upset   Penicillins     REACTION: serious dehydration, hospitalized in 1990    Review of Systems  Constitutional:  Negative for malaise/fatigue.  Eyes:  Negative for blurred vision.  Respiratory:  Negative for shortness of breath.   Cardiovascular:  Negative for chest pain.  Genitourinary:  Negative for dysuria.  Neurological:  Negative for dizziness, weakness and headaches.      Objective:     BP 120/60 (BP Location: Left Arm, Patient Position: Sitting, Cuff Size: Normal)   Pulse 85   Temp 97.7 F (36.5 C) (Oral)   Ht 4\' 8"  (1.422 m)   Wt 158 lb 1.6 oz (71.7 kg)   SpO2 98%   BMI 35.45 kg/m  BP Readings from Last 3 Encounters:  03/25/23 120/60  02/05/23 112/74  01/09/23 122/80   Wt Readings from Last 3 Encounters:  03/25/23 158 lb 1.6 oz (71.7 kg)  02/05/23 159 lb 9.6 oz (72.4 kg)  01/09/23 157 lb 3.2 oz (71.3 kg)      Physical Exam Vitals  reviewed.  Constitutional:      Appearance: Normal appearance.  HENT:     Head: Normocephalic and atraumatic.     Comments: Scalp is palpated.  No cystic lesions or skin lesions noted.  She has somewhat prominent midline ridge mid parietal area but no concerning bony mass.  Nontender.  No overlying erythema Cardiovascular:     Rate and Rhythm: Normal rate and regular rhythm.  Pulmonary:     Effort: Pulmonary effort is normal.     Breath sounds: Normal breath sounds. No rales.  Musculoskeletal:     Right lower leg: No edema.     Left lower leg: No edema.  Skin:    Comments: Lower legs are examined and she has generally dry skin.  Has a few faint areas of erythema and slightly scaly.  No vesicles.  No pustules.  Neurological:     Mental Status: She is alert.      No results  found for any visits on 03/25/23.  Last metabolic panel Lab Results  Component Value Date   GLUCOSE 85 01/09/2023   NA 137 01/09/2023   K 3.0 (L) 01/09/2023   CL 99 01/09/2023   CO2 30 01/09/2023   BUN 11 01/09/2023   CREATININE 0.62 01/09/2023   GFR 82.93 01/09/2023   CALCIUM 9.7 01/09/2023   PROT 6.7 01/09/2023   ALBUMIN 4.1 01/09/2023   BILITOT 0.4 01/09/2023   ALKPHOS 55 01/09/2023   AST 18 01/09/2023   ALT 17 01/09/2023   ANIONGAP 13 04/27/2014      The ASCVD Risk score (Arnett DK, et al., 2019) failed to calculate for the following reasons:   The 2019 ASCVD risk score is only valid for ages 48 to 75    Assessment & Plan:   #1 prominent bony ridge mid parietal area.  No concerning masses.  No cystic lesions.  Reassurance given.  #2 hypertension stable and well-controlled on lisinopril HCTZ.  Recent hypokalemia by labs.  Needs follow-up.  Recheck basic metabolic panel.  Discussed high potassium diet.  She tends to eat a lot of vegetables and fruits- especially this time of year.  If still down consider potassium replacement with supplement versus change to potassium sparing diuretic  #3 dry skin dermatitis rash lower legs -Discussed importance of avoiding prolonged bathing and especially hot water -Liberal use of moisturizers especially after bathing -Triamcinolone 0.1% cream to use twice daily as needed  #4 chronic hoarseness.  Nasolaryngoscopy earlier today reassuring.  She will try Zyrtec for postnasal drip symptoms.  Could also add Flonase or Nasacort AQ   No follow-ups on file.    Evelena Peat, MD

## 2023-04-15 DIAGNOSIS — M79641 Pain in right hand: Secondary | ICD-10-CM | POA: Diagnosis not present

## 2023-04-15 DIAGNOSIS — M79642 Pain in left hand: Secondary | ICD-10-CM | POA: Diagnosis not present

## 2023-05-15 DIAGNOSIS — M9901 Segmental and somatic dysfunction of cervical region: Secondary | ICD-10-CM | POA: Diagnosis not present

## 2023-05-15 DIAGNOSIS — M9902 Segmental and somatic dysfunction of thoracic region: Secondary | ICD-10-CM | POA: Diagnosis not present

## 2023-05-15 DIAGNOSIS — M62830 Muscle spasm of back: Secondary | ICD-10-CM | POA: Diagnosis not present

## 2023-05-15 DIAGNOSIS — M9903 Segmental and somatic dysfunction of lumbar region: Secondary | ICD-10-CM | POA: Diagnosis not present

## 2023-05-19 DIAGNOSIS — M9903 Segmental and somatic dysfunction of lumbar region: Secondary | ICD-10-CM | POA: Diagnosis not present

## 2023-05-19 DIAGNOSIS — M9902 Segmental and somatic dysfunction of thoracic region: Secondary | ICD-10-CM | POA: Diagnosis not present

## 2023-05-19 DIAGNOSIS — M9901 Segmental and somatic dysfunction of cervical region: Secondary | ICD-10-CM | POA: Diagnosis not present

## 2023-05-19 DIAGNOSIS — M62830 Muscle spasm of back: Secondary | ICD-10-CM | POA: Diagnosis not present

## 2023-05-21 DIAGNOSIS — M9903 Segmental and somatic dysfunction of lumbar region: Secondary | ICD-10-CM | POA: Diagnosis not present

## 2023-05-21 DIAGNOSIS — M62830 Muscle spasm of back: Secondary | ICD-10-CM | POA: Diagnosis not present

## 2023-05-21 DIAGNOSIS — M9901 Segmental and somatic dysfunction of cervical region: Secondary | ICD-10-CM | POA: Diagnosis not present

## 2023-05-21 DIAGNOSIS — M9902 Segmental and somatic dysfunction of thoracic region: Secondary | ICD-10-CM | POA: Diagnosis not present

## 2023-05-22 DIAGNOSIS — M9901 Segmental and somatic dysfunction of cervical region: Secondary | ICD-10-CM | POA: Diagnosis not present

## 2023-05-22 DIAGNOSIS — M9902 Segmental and somatic dysfunction of thoracic region: Secondary | ICD-10-CM | POA: Diagnosis not present

## 2023-05-22 DIAGNOSIS — M9903 Segmental and somatic dysfunction of lumbar region: Secondary | ICD-10-CM | POA: Diagnosis not present

## 2023-05-22 DIAGNOSIS — M62830 Muscle spasm of back: Secondary | ICD-10-CM | POA: Diagnosis not present

## 2023-05-27 DIAGNOSIS — M62830 Muscle spasm of back: Secondary | ICD-10-CM | POA: Diagnosis not present

## 2023-05-27 DIAGNOSIS — M9903 Segmental and somatic dysfunction of lumbar region: Secondary | ICD-10-CM | POA: Diagnosis not present

## 2023-05-27 DIAGNOSIS — M9901 Segmental and somatic dysfunction of cervical region: Secondary | ICD-10-CM | POA: Diagnosis not present

## 2023-05-27 DIAGNOSIS — M9902 Segmental and somatic dysfunction of thoracic region: Secondary | ICD-10-CM | POA: Diagnosis not present

## 2023-05-28 DIAGNOSIS — M62830 Muscle spasm of back: Secondary | ICD-10-CM | POA: Diagnosis not present

## 2023-05-28 DIAGNOSIS — M9902 Segmental and somatic dysfunction of thoracic region: Secondary | ICD-10-CM | POA: Diagnosis not present

## 2023-05-28 DIAGNOSIS — M9903 Segmental and somatic dysfunction of lumbar region: Secondary | ICD-10-CM | POA: Diagnosis not present

## 2023-05-28 DIAGNOSIS — M9901 Segmental and somatic dysfunction of cervical region: Secondary | ICD-10-CM | POA: Diagnosis not present

## 2023-05-29 DIAGNOSIS — M9903 Segmental and somatic dysfunction of lumbar region: Secondary | ICD-10-CM | POA: Diagnosis not present

## 2023-05-29 DIAGNOSIS — M62830 Muscle spasm of back: Secondary | ICD-10-CM | POA: Diagnosis not present

## 2023-05-29 DIAGNOSIS — M9902 Segmental and somatic dysfunction of thoracic region: Secondary | ICD-10-CM | POA: Diagnosis not present

## 2023-05-29 DIAGNOSIS — M9901 Segmental and somatic dysfunction of cervical region: Secondary | ICD-10-CM | POA: Diagnosis not present

## 2023-06-02 DIAGNOSIS — M62830 Muscle spasm of back: Secondary | ICD-10-CM | POA: Diagnosis not present

## 2023-06-02 DIAGNOSIS — M9902 Segmental and somatic dysfunction of thoracic region: Secondary | ICD-10-CM | POA: Diagnosis not present

## 2023-06-02 DIAGNOSIS — M9903 Segmental and somatic dysfunction of lumbar region: Secondary | ICD-10-CM | POA: Diagnosis not present

## 2023-06-02 DIAGNOSIS — M9901 Segmental and somatic dysfunction of cervical region: Secondary | ICD-10-CM | POA: Diagnosis not present

## 2023-06-03 DIAGNOSIS — M62830 Muscle spasm of back: Secondary | ICD-10-CM | POA: Diagnosis not present

## 2023-06-03 DIAGNOSIS — M9902 Segmental and somatic dysfunction of thoracic region: Secondary | ICD-10-CM | POA: Diagnosis not present

## 2023-06-03 DIAGNOSIS — M9901 Segmental and somatic dysfunction of cervical region: Secondary | ICD-10-CM | POA: Diagnosis not present

## 2023-06-03 DIAGNOSIS — M9903 Segmental and somatic dysfunction of lumbar region: Secondary | ICD-10-CM | POA: Diagnosis not present

## 2023-06-05 DIAGNOSIS — M62830 Muscle spasm of back: Secondary | ICD-10-CM | POA: Diagnosis not present

## 2023-06-05 DIAGNOSIS — M9902 Segmental and somatic dysfunction of thoracic region: Secondary | ICD-10-CM | POA: Diagnosis not present

## 2023-06-05 DIAGNOSIS — M9903 Segmental and somatic dysfunction of lumbar region: Secondary | ICD-10-CM | POA: Diagnosis not present

## 2023-06-05 DIAGNOSIS — M9901 Segmental and somatic dysfunction of cervical region: Secondary | ICD-10-CM | POA: Diagnosis not present

## 2023-06-10 DIAGNOSIS — M9903 Segmental and somatic dysfunction of lumbar region: Secondary | ICD-10-CM | POA: Diagnosis not present

## 2023-06-10 DIAGNOSIS — M9901 Segmental and somatic dysfunction of cervical region: Secondary | ICD-10-CM | POA: Diagnosis not present

## 2023-06-10 DIAGNOSIS — M9902 Segmental and somatic dysfunction of thoracic region: Secondary | ICD-10-CM | POA: Diagnosis not present

## 2023-06-10 DIAGNOSIS — M62830 Muscle spasm of back: Secondary | ICD-10-CM | POA: Diagnosis not present

## 2023-06-16 DIAGNOSIS — M9902 Segmental and somatic dysfunction of thoracic region: Secondary | ICD-10-CM | POA: Diagnosis not present

## 2023-06-16 DIAGNOSIS — M9901 Segmental and somatic dysfunction of cervical region: Secondary | ICD-10-CM | POA: Diagnosis not present

## 2023-06-16 DIAGNOSIS — M9903 Segmental and somatic dysfunction of lumbar region: Secondary | ICD-10-CM | POA: Diagnosis not present

## 2023-06-16 DIAGNOSIS — M62830 Muscle spasm of back: Secondary | ICD-10-CM | POA: Diagnosis not present

## 2023-07-21 DIAGNOSIS — M79641 Pain in right hand: Secondary | ICD-10-CM | POA: Diagnosis not present

## 2023-07-21 DIAGNOSIS — M79642 Pain in left hand: Secondary | ICD-10-CM | POA: Diagnosis not present

## 2023-07-23 ENCOUNTER — Other Ambulatory Visit: Payer: Self-pay | Admitting: Family Medicine

## 2023-08-07 DIAGNOSIS — L821 Other seborrheic keratosis: Secondary | ICD-10-CM | POA: Diagnosis not present

## 2023-08-07 DIAGNOSIS — D225 Melanocytic nevi of trunk: Secondary | ICD-10-CM | POA: Diagnosis not present

## 2023-08-07 DIAGNOSIS — Z85828 Personal history of other malignant neoplasm of skin: Secondary | ICD-10-CM | POA: Diagnosis not present

## 2023-08-07 DIAGNOSIS — Z08 Encounter for follow-up examination after completed treatment for malignant neoplasm: Secondary | ICD-10-CM | POA: Diagnosis not present

## 2023-08-07 DIAGNOSIS — L814 Other melanin hyperpigmentation: Secondary | ICD-10-CM | POA: Diagnosis not present

## 2023-08-07 DIAGNOSIS — Z872 Personal history of diseases of the skin and subcutaneous tissue: Secondary | ICD-10-CM | POA: Diagnosis not present

## 2023-08-07 DIAGNOSIS — L905 Scar conditions and fibrosis of skin: Secondary | ICD-10-CM | POA: Diagnosis not present

## 2023-08-22 ENCOUNTER — Other Ambulatory Visit: Payer: Self-pay | Admitting: Family Medicine

## 2023-08-23 DIAGNOSIS — H40033 Anatomical narrow angle, bilateral: Secondary | ICD-10-CM | POA: Diagnosis not present

## 2023-08-23 DIAGNOSIS — H2513 Age-related nuclear cataract, bilateral: Secondary | ICD-10-CM | POA: Diagnosis not present

## 2023-12-22 ENCOUNTER — Ambulatory Visit (INDEPENDENT_AMBULATORY_CARE_PROVIDER_SITE_OTHER): Payer: Medicare PPO

## 2023-12-22 ENCOUNTER — Encounter: Payer: Self-pay | Admitting: Family Medicine

## 2023-12-22 ENCOUNTER — Ambulatory Visit (INDEPENDENT_AMBULATORY_CARE_PROVIDER_SITE_OTHER): Payer: Medicare PPO | Admitting: Family Medicine

## 2023-12-22 VITALS — BP 120/64 | HR 81 | Temp 99.8°F | Ht <= 58 in | Wt 159.0 lb

## 2023-12-22 VITALS — BP 120/64 | HR 81 | Temp 99.8°F | Ht <= 58 in | Wt 159.1 lb

## 2023-12-22 DIAGNOSIS — R42 Dizziness and giddiness: Secondary | ICD-10-CM | POA: Diagnosis not present

## 2023-12-22 DIAGNOSIS — I10 Essential (primary) hypertension: Secondary | ICD-10-CM | POA: Diagnosis not present

## 2023-12-22 DIAGNOSIS — Z0000 Encounter for general adult medical examination without abnormal findings: Secondary | ICD-10-CM

## 2023-12-22 DIAGNOSIS — E785 Hyperlipidemia, unspecified: Secondary | ICD-10-CM | POA: Diagnosis not present

## 2023-12-22 DIAGNOSIS — R300 Dysuria: Secondary | ICD-10-CM | POA: Diagnosis not present

## 2023-12-22 LAB — CBC WITH DIFFERENTIAL/PLATELET
Basophils Absolute: 0 10*3/uL (ref 0.0–0.1)
Basophils Relative: 0.8 % (ref 0.0–3.0)
Eosinophils Absolute: 0.1 10*3/uL (ref 0.0–0.7)
Eosinophils Relative: 2.6 % (ref 0.0–5.0)
HCT: 44.4 % (ref 36.0–46.0)
Hemoglobin: 14.9 g/dL (ref 12.0–15.0)
Lymphocytes Relative: 35.9 % (ref 12.0–46.0)
Lymphs Abs: 1.8 10*3/uL (ref 0.7–4.0)
MCHC: 33.7 g/dL (ref 30.0–36.0)
MCV: 97 fl (ref 78.0–100.0)
Monocytes Absolute: 0.4 10*3/uL (ref 0.1–1.0)
Monocytes Relative: 8.9 % (ref 3.0–12.0)
Neutro Abs: 2.6 10*3/uL (ref 1.4–7.7)
Neutrophils Relative %: 51.8 % (ref 43.0–77.0)
Platelets: 241 10*3/uL (ref 150.0–400.0)
RBC: 4.58 Mil/uL (ref 3.87–5.11)
RDW: 13.7 % (ref 11.5–15.5)
WBC: 5 10*3/uL (ref 4.0–10.5)

## 2023-12-22 LAB — COMPREHENSIVE METABOLIC PANEL WITH GFR
ALT: 21 U/L (ref 0–35)
AST: 23 U/L (ref 0–37)
Albumin: 4.5 g/dL (ref 3.5–5.2)
Alkaline Phosphatase: 65 U/L (ref 39–117)
BUN: 15 mg/dL (ref 6–23)
CO2: 29 meq/L (ref 19–32)
Calcium: 10.4 mg/dL (ref 8.4–10.5)
Chloride: 102 meq/L (ref 96–112)
Creatinine, Ser: 0.71 mg/dL (ref 0.40–1.20)
GFR: 78.66 mL/min (ref >60.00)
Glucose, Bld: 102 mg/dL — ABNORMAL HIGH (ref 70–99)
Potassium: 3.5 meq/L (ref 3.5–5.1)
Sodium: 140 meq/L (ref 135–145)
Total Bilirubin: 0.7 mg/dL (ref 0.2–1.2)
Total Protein: 7.2 g/dL (ref 6.0–8.3)

## 2023-12-22 MED ORDER — MECLIZINE HCL 25 MG PO TABS: ORAL_TABLET | ORAL | 0 refills | Status: AC

## 2023-12-22 MED ORDER — LISINOPRIL-HYDROCHLOROTHIAZIDE 20-12.5 MG PO TABS: 1.0000 | ORAL_TABLET | Freq: Every day | ORAL | 3 refills | Status: AC

## 2023-12-22 NOTE — Patient Instructions (Addendum)
 [name] , Thank you for taking time to come for your Medicare Wellness Visit. I appreciate your ongoing commitment to your health goals. Please review the following plan we discussed and let me know if I can assist you in the future.   Referrals/Orders/Follow-Ups/Clinician Recommendations:   This is a list of the screening recommended for you and due dates:  Health Maintenance  Topic Date Due   Zoster (Shingles) Vaccine (1 of 2) 08/08/1990   Flu Shot  04/24/2023   COVID-19 Vaccine (3 - 2024-25 season) 05/25/2023   Medicare Annual Wellness Visit  12/21/2024   DTaP/Tdap/Td vaccine (3 - Tdap) 06/20/2027   Pneumonia Vaccine  Completed   DEXA scan (bone density measurement)  Completed   HPV Vaccine  Aged Out   Hepatitis C Screening  Discontinued    Advanced directives: (Copy Requested) Please bring a copy of your health care power of attorney and living will to the office to be added to your chart at your convenience. You can mail to [facility] [address] [place], [place] [address] or email to [email or web address]  Next Medicare Annual Wellness Visit scheduled for next year:

## 2023-12-22 NOTE — Progress Notes (Signed)
 Subjective:   [name] is a 83 y.o. who presents for a Medicare Wellness preventive visit.  Visit Complete: In person    Persons Participating in Visit: Patient.  AWV Questionnaire: No: Patient Medicare AWV questionnaire was not completed prior to this visit.  Cardiac Risk Factors include: advanced age (>66men, >58 women);hypertension     Objective:    Today's Vitals   12/22/23 0849  BP: 120/64  Pulse: 81  Temp: 99.8 F (37.7 C)  TempSrc: Oral  SpO2: 90%  Weight: 159 lb 1.6 oz (72.2 kg)  Height: 4\' 8"  (1.422 m)   Body mass index is 35.67 kg/m.     12/22/2023    8:58 AM 02/27/2021    2:42 PM 11/15/2019    9:05 AM 11/26/2016    9:46 AM 10/01/2016   10:15 AM  Advanced Directives  Does Patient Have a Medical Advance Directive? Yes Yes Yes Yes Yes  Type of Estate agent of [place];Living will Living will Healthcare Power of [place];Living will    Does patient want to make changes to medical advance directive?   No - Patient declined    Copy of Healthcare Power of Attorney in Chart? No - copy requested  No - copy requested      Current Medications (verified) Outpatient Encounter Medications as of 12/22/2023  Medication Sig   acetaminophen (TYLENOL) 650 MG CR tablet Take 1,300 mg by mouth every 8 (eight) hours as needed (For arthritis pain.).   Cetirizine HCl (ZYRTEC ALLERGY PO) Take by mouth.   lisinopril-hydrochlorothiazide (ZESTORETIC) 20-12.5 MG tablet TAKE ONE TABLET ONCE DAILY   meclizine (ANTIVERT) 25 MG tablet Take one tablet by mouth every 8 hours as needed for vertigo   montelukast (SINGULAIR) 10 MG tablet Take 1 tablet (10 mg total) by mouth at bedtime.   potassium chloride SA (KLOR-CON M) 20 MEQ tablet Take 2 tablets (40 mEq total) by mouth daily for 3 days.   triamcinolone cream (KENALOG) 0.1 % APPLY TO THE AFFECTED AREA(S) TWICE DAILY   No facility-administered encounter medications on file as of 12/22/2023.    Allergies  (verified) Alendronate sodium, Aspirin, Celecoxib, and Penicillins   History: Past Medical History:  Diagnosis Date   Arthritis    Cataract    Hypertension    Osteoporosis    Past Surgical History:  Procedure Laterality Date   BUNIONECTOMY     CATARACTS     COLONOSCOPY  06/2002   JOINT REPLACEMENT  2009, 2010   knee replacements   NASAL SINUS SURGERY     VOCAL CORD POLYPS     REMOVED   Family History  Problem Relation Age of Onset   Colon cancer Sister 85   Cancer Father        prostate   Arthritis Father    Social History   Socioeconomic History   Marital status: Married    Spouse name: Not on file   Number of children: Not on file   Years of education: Not on file   Highest education level: Master's degree (e.g., MA, MS, MEng, MEd, MSW, MBA)  Occupational History   Not on file  Tobacco Use   Smoking status: Former    Current packs/day: 0.00    Average packs/day: 0.5 packs/day for 3.0 years (1.5 ttl pk-yrs)    Types: Cigarettes    Start date: 11/07/1984    Quit date: 11/08/1987    Years since quitting: 36.1   Smokeless tobacco: Never  Vaping Use  Vaping status: Never Used  Substance and Sexual Activity   Alcohol use: No   Drug use: No   Sexual activity: Not on file  Other Topics Concern   Not on file  Social History Narrative   Not on file   Social Drivers of Health   Financial Resource Strain: Low Risk  (12/22/2023)   Overall Financial Resource Strain (CARDIA)    Difficulty of Paying Living Expenses: Not hard at all  Food Insecurity: No Food Insecurity (12/22/2023)   Hunger Vital Sign    Worried About Running Out of Food in the Last Year: Never true    Ran Out of Food in the Last Year: Never true  Transportation Needs: No Transportation Needs (12/22/2023)   PRAPARE - Administrator, Civil Service (Medical): No    Lack of Transportation (Non-Medical): No  Physical Activity: Inactive (12/22/2023)   Exercise Vital Sign    Days of Exercise  per Week: 0 days    Minutes of Exercise per Session: 0 min  Stress: No Stress Concern Present (12/22/2023)   [employer] of Occupational Health - Occupational Stress Questionnaire    Feeling of Stress : Not at all  Social Connections: Moderately Integrated (12/22/2023)   Social Connection and Isolation Panel [NHANES]    Frequency of Communication with Friends and Family: More than three times a week    Frequency of Social Gatherings with Friends and Family: More than three times a week    Attends Religious Services: More than 4 times per year    Active Member of [employer] or Organizations: Yes    Attends Banker Meetings: More than 4 times per year    Marital Status: Widowed    Tobacco Counseling Counseling given: Not Answered    Clinical Intake:  Pre-visit preparation completed: Yes  Pain : No/denies pain     BMI - recorded: 35.67 Nutritional Status: BMI > 30  Obese Nutritional Risks: None Diabetes: No  No results found for: "HGBA1C"   How often do you need to have someone help you when you read instructions, pamphlets, or other written materials from your doctor or pharmacy?: 1 - Never  Interpreter Needed?: No  Information entered by :: Theresa Mulligan LPN   Activities of Daily Living     12/22/2023    8:55 AM  In your present state of health, do you have any difficulty performing the following activities:  Hearing? 0  Vision? 0  Difficulty concentrating or making decisions? 0  Walking or climbing stairs? 0  Dressing or bathing? 0  Doing errands, shopping? 0  Preparing Food and eating ? N  Using the Toilet? N  In the past six months, have you accidently leaked urine? Y  Comment Wears Pads. Followed by medical attention.  Do you have problems with loss of bowel control? N  Managing your Medications? N  Managing your Finances? N  Housekeeping or managing your Housekeeping? N    Patient Care Team: Kristian Covey, MD as PCP -  General  Indicate any recent Medical Services you may have received from other than [facility] providers in the past year (date may be approximate).     Assessment:   This is a routine wellness examination for [name].  Hearing/Vision screen Hearing Screening - Comments:: Denies hearing difficulties   Vision Screening - Comments:: Wears rx glasses - up to date with routine eye exams with  [facility]   Goals Addressed  This Visit's Progress     Stay Active (pt-stated)         Depression Screen     12/22/2023    8:54 AM 08/28/2022    9:37 AM 06/21/2022   10:16 AM 02/27/2021    2:40 PM 06/20/2020    9:08 AM 11/15/2019    9:09 AM 07/29/2016    9:40 AM  PHQ 2/9 Scores  PHQ - 2 Score 0 0 0 0 0 0 0  PHQ- 9 Score     0      Fall Risk     12/22/2023    8:57 AM 01/08/2023    7:06 PM 08/28/2022    9:37 AM 06/21/2022   10:16 AM 02/27/2021    2:43 PM  Fall Risk   Falls in the past year? 0 0 0 0 0  Number falls in past yr: 0  0 0 0  Injury with Fall? 0  0 0 0  Risk for fall due to : No Fall Risks  No Fall Risks History of fall(s) Impaired vision  Follow up Falls prevention discussed;Falls evaluation completed  Falls evaluation completed Falls evaluation completed Falls prevention discussed    MEDICARE RISK AT HOME:  Medicare Risk at Home Any stairs in or around the home?: Yes If so, are there any without handrails?: No Home free of loose throw rugs in walkways, pet beds, electrical cords, etc?: Yes Adequate lighting in your home to reduce risk of falls?: Yes Life alert?: No Use of a cane, walker or w/c?: No Grab bars in the bathroom?: Yes Shower chair or bench in shower?: Yes Elevated toilet seat or a handicapped toilet?: Yes  TIMED UP AND GO:  Was the test performed?  Yes  Length of time to ambulate 10 feet: 10 sec Gait slow and steady without use of assistive device  Cognitive Function: 6CIT completed        12/22/2023    8:58 AM 02/27/2021    2:46 PM   6CIT Screen  What Year? 0 points 0 points  What month? 0 points 0 points  What time? 0 points 0 points  Count back from 20 0 points 0 points  Months in reverse 0 points 0 points  Repeat phrase 0 points 0 points  Total Score 0 points 0 points    Immunizations Immunization History  Administered Date(s) Administered   Fluad Quad(high Dose 65+) 06/20/2020, 07/11/2021   Influenza Split 07/17/2011, 06/04/2012   Influenza Whole 08/02/2009, 06/14/2010   Influenza, High Dose Seasonal PF 07/07/2013, 07/11/2015, 07/29/2016, 06/19/2017   Influenza,inj,Quad PF,6+ Mos 05/26/2014, 06/29/2018   Influenza,inj,quad, With Preservative 07/02/2019   Influenza-Unspecified 07/02/2019   Moderna Sars-Covid-2 Vaccination 10/16/2019, 11/13/2019   Pneumococcal Conjugate-13 07/11/2015   Pneumococcal Polysaccharide-23 09/23/2006   Td 09/23/2006, 06/19/2017   Zoster, Live 06/04/2012    Screening Tests Health Maintenance  Topic Date Due   Zoster Vaccines- Shingrix (1 of 2) 08/08/1990   INFLUENZA VACCINE  04/24/2023   COVID-19 Vaccine (3 - 2024-25 season) 05/25/2023   Medicare Annual Wellness (AWV)  12/21/2024   DTaP/Tdap/Td (3 - Tdap) 06/20/2027   Pneumonia Vaccine 8+ Years old  Completed   DEXA SCAN  Completed   HPV VACCINES  Aged Out   Hepatitis C Screening  Discontinued    Health Maintenance  Health Maintenance Due  Topic Date Due   Zoster Vaccines- Shingrix (1 of 2) 08/08/1990   INFLUENZA VACCINE  04/24/2023   COVID-19 Vaccine (3 - 2024-25 season) 05/25/2023  Health Maintenance Items Addressed:    Additional Screening:  Vision Screening: Recommended annual ophthalmology exams for early detection of glaucoma and other disorders of the eye.  Dental Screening: Recommended annual dental exams for proper oral hygiene  Community Resource Referral / Chronic Care Management: CRR required this visit?  No   CCM required this visit?  No     Plan:     I have personally reviewed and  noted the following in the patient's chart:   Medical and social history Use of alcohol, tobacco or illicit drugs  Current medications and supplements including opioid prescriptions. Patient is not currently taking opioid prescriptions. Functional ability and status Nutritional status Physical activity Advanced directives List of other physicians Hospitalizations, surgeries, and ER visits in previous 12 months Vitals Screenings to include cognitive, depression, and falls Referrals and appointments  In addition, I have reviewed and discussed with patient certain preventive protocols, quality metrics, and best practice recommendations. A written personalized care plan for preventive services as well as general preventive health recommendations were provided to patient.     Tillie Rung, LPN   12/30/8117   After Visit Summary: (In Person-Declined) Patient declined AVS at this time.  Notes: Nothing significant to report at this time.

## 2023-12-22 NOTE — Addendum Note (Signed)
 Addended by: Christy Sartorius on: 12/22/2023 10:06 AM   Modules accepted: Orders

## 2023-12-22 NOTE — Patient Instructions (Signed)
 Consider RSV and Shingrix vaccines this year.

## 2023-12-22 NOTE — Progress Notes (Signed)
 Established Patient Office Visit  Subjective   Patient ID: [name], female    DOB: 1941/06/25  Age: 83 y.o. MRN: [id number]  No chief complaint on file.   HPI   [name] is seen today for medical follow-up.  She was here today for Medicare wellness visit as well with one of our nurses.  Generally doing well.  She lives in [place].  She has a son that lives nearby who helps care for her.  She is retired Chartered loss adjuster.  She has hypertension treated with lisinopril HCTZ.  Compliant with medication.  Needs follow-up labs.  Did have episode yesterday of some diarrhea which lasted only for 1 day.  No vomiting or nausea.  Had some mild lightheadedness but improved today.  He is keeping down fluids well.  No recent fever.  No abdominal pain.  She does have intermittent vertigo and takes Antivert as needed and requesting refill.  She knows to use this sparingly.  She has history of hyperlipidemia but no history of cerebrovascular disease or coronary disease.  No recent chest pains.  She is aged out of further colon cancer screening.  She decided several years ago to forego any further mammograms.  She does have history of osteoporosis and has scoliosis.  She had intolerance with Fosamax.  Past Medical History:  Diagnosis Date   Arthritis    Cataract    Hypertension    Osteoporosis    Past Surgical History:  Procedure Laterality Date   BUNIONECTOMY     CATARACTS     COLONOSCOPY  06/2002   JOINT REPLACEMENT  2009, 2010   knee replacements   NASAL SINUS SURGERY     VOCAL CORD POLYPS     REMOVED    reports that she quit smoking about 36 years ago. Her smoking use included cigarettes. She started smoking about 39 years ago. She has a 1.5 pack-year smoking history. She has never used smokeless tobacco. She reports that she does not drink alcohol and does not use drugs. family history includes Arthritis in her father; Cancer in her father; Colon cancer (age of onset: 1) in her  sister. Allergies  Allergen Reactions   Alendronate Sodium     REACTION: constipation   Aspirin    Celecoxib     REACTION: GI upset   Penicillins     REACTION: serious dehydration, hospitalized in 1990    Review of Systems  Constitutional:  Negative for malaise/fatigue.  Eyes:  Negative for blurred vision.  Respiratory:  Negative for shortness of breath.   Cardiovascular:  Negative for chest pain.  Neurological:  Negative for dizziness, weakness and headaches.      Objective:     BP 120/64 (BP Location: Left Arm, Patient Position: Sitting, Cuff Size: Normal)   Pulse 81   Temp 99.8 F (37.7 C) (Oral)   Ht 4\' 8"  (1.422 m)   Wt 159 lb (72.1 kg)   SpO2 95%   BMI 35.65 kg/m  BP Readings from Last 3 Encounters:  12/22/23 120/64  12/22/23 120/64  03/25/23 120/60   Wt Readings from Last 3 Encounters:  12/22/23 159 lb (72.1 kg)  12/22/23 159 lb 1.6 oz (72.2 kg)  03/25/23 158 lb 1.6 oz (71.7 kg)      Physical Exam Vitals reviewed.  Constitutional:      General: She is not in acute distress.    Appearance: She is well-developed. She is not ill-appearing.  Eyes:     Pupils:  Pupils are equal, round, and reactive to light.  Neck:     Thyroid: No thyromegaly.     Vascular: No JVD.  Cardiovascular:     Rate and Rhythm: Normal rate and regular rhythm.     Heart sounds:     No gallop.  Pulmonary:     Effort: Pulmonary effort is normal. No respiratory distress.     Breath sounds: Normal breath sounds. No wheezing or rales.  Musculoskeletal:     Cervical back: Neck supple.     Right lower leg: No edema.     Left lower leg: No edema.  Neurological:     Mental Status: She is alert.      No results found for any visits on 12/22/23.    The ASCVD Risk score (Arnett DK, et al., 2019) failed to calculate for the following reasons:   The 2019 ASCVD risk score is only valid for ages 19 to 80    Assessment & Plan:   #1 Hypertension-stable and well-controlled on  lisinopril HCTZ.  Check comprehensive metabolic panel.  Continue high potassium diet.  #2 longstanding history of intermittent vertigo.  Patient requesting refill of meclizine which has helped in the past.  We cautioned her about potential adverse side effects.  Prescription written to use cautiously as needed  #3 hyperlipidemia.  Patient is fairly adamant that she would not wish to treatment or what her lipids are we therefore will not check any further lipid panels.  #4 health maintenance.  Discussed Shingrix vaccine and RSV and she will consider getting at local pharmacy         Evelena Peat, MD

## 2023-12-23 ENCOUNTER — Telehealth: Payer: Self-pay

## 2023-12-23 LAB — URINALYSIS W MICROSCOPIC + REFLEX CULTURE
Bacteria, UA: NONE SEEN /HPF
Bilirubin Urine: NEGATIVE
Glucose, UA: NEGATIVE
Hgb urine dipstick: NEGATIVE
Hyaline Cast: NONE SEEN /LPF
Ketones, ur: NEGATIVE
Leukocyte Esterase: NEGATIVE
Nitrites, Initial: NEGATIVE
Protein, ur: NEGATIVE
Specific Gravity, Urine: 1.019 (ref 1.001–1.035)
Squamous Epithelial / HPF: NONE SEEN /HPF (ref <=5)
WBC, UA: NONE SEEN /HPF (ref 0–5)
pH: 7 (ref 5.0–8.0)

## 2023-12-23 LAB — NO CULTURE INDICATED

## 2023-12-23 NOTE — Telephone Encounter (Signed)
 Copied from CRM [id number]. Topic: Clinical - Lab/Test Results >> Dec 23, 2023  2:57 PM [name] wrote: Reason for CRM: Patient's results have been relayed. Patient is asking as far as potassium, it was low and wondering if she needs to eat or take more of it? Please follow up.

## 2023-12-24 NOTE — Telephone Encounter (Signed)
Patient informed of message below and voiced understanding

## 2024-02-02 ENCOUNTER — Encounter: Payer: Self-pay | Admitting: Family Medicine

## 2024-02-02 ENCOUNTER — Ambulatory Visit: Admitting: Family Medicine

## 2024-02-02 VITALS — BP 140/70 | HR 82 | Temp 97.3°F | Wt 160.9 lb

## 2024-02-02 DIAGNOSIS — M2669 Other specified disorders of temporomandibular joint: Secondary | ICD-10-CM

## 2024-02-02 DIAGNOSIS — M159 Polyosteoarthritis, unspecified: Secondary | ICD-10-CM

## 2024-02-02 DIAGNOSIS — I10 Essential (primary) hypertension: Secondary | ICD-10-CM | POA: Diagnosis not present

## 2024-02-02 NOTE — Progress Notes (Signed)
 Established Patient Office Visit  Subjective   Patient ID: [name], female    DOB: 1940-11-03  Age: 83 y.o. MRN: [id number]  Chief Complaint  Patient presents with   Mouth Injury    HPI   [name] is here today for several items as follows  She relates possible left parotid issue.  She states that when she is eating she hears a sound like a "crunching "sensation.  No associated pain.  No redness.  Possibly some very mild swelling.  She had some similar years ago and after eating lemon candy feels like her symptoms resolved.  She has had no recent change of appetite.  No lymphadenopathy.  No significant pain with chewing.  She has hypertension treated with lisinopril  HCTZ.  Blood pressure was significant elevated when she first got here today but came down substantially after rest.  She has been compliant with medication.  No recent dizziness or headaches.  Patient requesting a handicap sticker.  She does have some osteoarthritis issues.  Struggles to walk more than 200 feet without stopping to rest. She has recently acquired a walker with a seat for when she is out that she can stop and take rest when needed.  She is also asking for a letter for her local postmaster to try to get her mailbox moved away from the road.  She is very close to a very busy road and worries about her risk of falling.  Past Medical History:  Diagnosis Date   Arthritis    Cataract    Hypertension    Osteoporosis    Past Surgical History:  Procedure Laterality Date   BUNIONECTOMY     CATARACTS     COLONOSCOPY  06/2002   JOINT REPLACEMENT  2009, 2010   knee replacements   NASAL SINUS SURGERY     VOCAL CORD POLYPS     REMOVED    reports that she quit smoking about 36 years ago. Her smoking use included cigarettes. She started smoking about 39 years ago. She has a 1.5 pack-year smoking history. She has never used smokeless tobacco. She reports that she does not drink alcohol and does not use  drugs. family history includes Arthritis in her father; Cancer in her father; Colon cancer (age of onset: 64) in her sister. Allergies  Allergen Reactions   Alendronate Sodium     REACTION: constipation   Aspirin    Celecoxib     REACTION: GI upset   Penicillins     REACTION: serious dehydration, hospitalized in 1990    Review of Systems  Constitutional:  Negative for malaise/fatigue.  Eyes:  Negative for blurred vision.  Respiratory:  Negative for shortness of breath.   Cardiovascular:  Negative for chest pain.  Neurological:  Negative for dizziness, weakness and headaches.      Objective:     BP (!) 140/70 (BP Location: Right Arm, Patient Position: Sitting, Cuff Size: Normal)   Pulse 82   Temp (!) 97.3 F (36.3 C) (Oral)   Wt 160 lb 14.4 oz (73 kg)   SpO2 96%   BMI 36.07 kg/m  BP Readings from Last 3 Encounters:  02/02/24 (!) 140/70  12/22/23 120/64  12/22/23 120/64   Wt Readings from Last 3 Encounters:  02/02/24 160 lb 14.4 oz (73 kg)  12/22/23 159 lb (72.1 kg)  12/22/23 159 lb 1.6 oz (72.2 kg)      Physical Exam Vitals reviewed.  Constitutional:      General:  She is not in acute distress.    Appearance: She is well-developed. She is not ill-appearing.  HENT:     Head:     Comments: Left parotid is examined.  No mass.  No obvious swelling.  No erythema.  No warmth.  Nontender.  She has some very mild click with opening and closing of the jaw on the left side but no associated tenderness or pain.    Mouth/Throat:     Comments: Oropharynx is clear.  Stensen's duct appears normal Eyes:     Pupils: Pupils are equal, round, and reactive to light.  Neck:     Thyroid : No thyromegaly.     Vascular: No JVD.  Cardiovascular:     Rate and Rhythm: Normal rate and regular rhythm.     Heart sounds:     No gallop.  Pulmonary:     Effort: Pulmonary effort is normal. No respiratory distress.     Breath sounds: Normal breath sounds. No wheezing or rales.   Musculoskeletal:     Cervical back: Neck supple.     Right lower leg: No edema.     Left lower leg: No edema.  Neurological:     Mental Status: She is alert.      No results found for any visits on 02/02/24.    The ASCVD Risk score (Arnett DK, et al., 2019) failed to calculate for the following reasons:   The 2019 ASCVD risk score is only valid for ages 93 to 66    Assessment & Plan:   #1 abnormal sensation with opening closing of the jaw.  Suspect this is related to TMJ osteoarthritis/crepitis and not parotid disease.  She does not have any evidence for swelling, erythema, or warmth.  No evidence for acute parotitis.  Reassurance.  Avoid hard to chew foods on that side  #2 hypertension.  Blood pressure initially up today but came down substantially after rest.  Continue lisinopril  HCTZ and low-sodium diet  #3 osteoarthritis involving multiple joints.  She has had some progressive challenges with ambulation.  She struggles to walk more than 200 feet without stopping to rest.  Handicap form completed.  She also requested letter to her postmaster to try to get her mailbox moved back from very busy highway and we produced a letter supporting that for her safety  Glean Lamy, MD

## 2024-02-02 NOTE — Patient Instructions (Signed)
 Suspect probably some temporomandibular joint arthritis issues.  Be in touch if any progressive pain with eating  Avoid hard to chew foods

## 2024-06-27 DIAGNOSIS — R300 Dysuria: Secondary | ICD-10-CM | POA: Diagnosis not present

## 2024-06-27 DIAGNOSIS — N760 Acute vaginitis: Secondary | ICD-10-CM | POA: Diagnosis not present

## 2024-08-10 DIAGNOSIS — L905 Scar conditions and fibrosis of skin: Secondary | ICD-10-CM | POA: Diagnosis not present

## 2024-08-10 DIAGNOSIS — Z85828 Personal history of other malignant neoplasm of skin: Secondary | ICD-10-CM | POA: Diagnosis not present

## 2024-08-10 DIAGNOSIS — L718 Other rosacea: Secondary | ICD-10-CM | POA: Diagnosis not present

## 2024-08-10 DIAGNOSIS — Z08 Encounter for follow-up examination after completed treatment for malignant neoplasm: Secondary | ICD-10-CM | POA: Diagnosis not present

## 2024-12-24 ENCOUNTER — Ambulatory Visit
